# Patient Record
Sex: Male | Born: 1950 | Race: Black or African American | Hispanic: No | Marital: Single | Smoking: Current every day smoker
Health system: Southern US, Community
[De-identification: ages and names within clinical notes are randomized; demographics above are authoritative.]

## PROBLEM LIST (undated history)

## (undated) DIAGNOSIS — I10 Essential (primary) hypertension: Secondary | ICD-10-CM

## (undated) DIAGNOSIS — I639 Cerebral infarction, unspecified: Secondary | ICD-10-CM

## (undated) DIAGNOSIS — E785 Hyperlipidemia, unspecified: Secondary | ICD-10-CM

## (undated) DIAGNOSIS — M1990 Unspecified osteoarthritis, unspecified site: Secondary | ICD-10-CM

## (undated) HISTORY — PX: MULTIPLE TOOTH EXTRACTIONS: SHX2053

## (undated) HISTORY — PX: LEG SURGERY: SHX1003

## (undated) HISTORY — DX: Hyperlipidemia, unspecified: E78.5

---

## 2002-12-03 ENCOUNTER — Inpatient Hospital Stay (HOSPITAL_COMMUNITY): Admission: EM | Admit: 2002-12-03 | Discharge: 2002-12-08 | Payer: Self-pay

## 2002-12-03 ENCOUNTER — Encounter: Payer: Self-pay | Admitting: Emergency Medicine

## 2002-12-07 ENCOUNTER — Encounter: Payer: Self-pay | Admitting: General Surgery

## 2002-12-08 ENCOUNTER — Encounter: Payer: Self-pay | Admitting: General Surgery

## 2004-05-26 ENCOUNTER — Emergency Department (HOSPITAL_COMMUNITY): Admission: EM | Admit: 2004-05-26 | Discharge: 2004-05-27 | Payer: Self-pay | Admitting: Emergency Medicine

## 2004-06-25 ENCOUNTER — Ambulatory Visit: Payer: Self-pay | Admitting: Family Medicine

## 2011-06-03 ENCOUNTER — Emergency Department (HOSPITAL_COMMUNITY): Payer: Medicaid Other

## 2011-06-03 ENCOUNTER — Emergency Department (HOSPITAL_COMMUNITY)
Admission: EM | Admit: 2011-06-03 | Discharge: 2011-06-03 | Disposition: A | Discharge status: Home / Self Care | Payer: Medicaid Other | Attending: Emergency Medicine | Admitting: Emergency Medicine

## 2011-06-03 ENCOUNTER — Encounter: Payer: Self-pay | Admitting: *Deleted

## 2011-06-03 DIAGNOSIS — I10 Essential (primary) hypertension: Secondary | ICD-10-CM

## 2011-06-03 DIAGNOSIS — H53149 Visual discomfort, unspecified: Secondary | ICD-10-CM | POA: Insufficient documentation

## 2011-06-03 DIAGNOSIS — R51 Headache: Secondary | ICD-10-CM

## 2011-06-03 MED ORDER — LISINOPRIL 20 MG PO TABS: 10.0000 mg | ORAL_TABLET | Freq: Every day | ORAL | Status: DC

## 2011-06-03 MED ORDER — PREDNISONE 20 MG PO TABS
60.0000 mg | ORAL_TABLET | Freq: Once | ORAL | Status: AC
  Administered 2011-06-03: 60 mg via ORAL
  Filled 2011-06-03: qty 2
  Filled 2011-06-03: qty 1

## 2011-06-03 MED ORDER — KETOROLAC TROMETHAMINE 10 MG PO TABS
10.0000 mg | ORAL_TABLET | Freq: Once | ORAL | Status: AC
  Administered 2011-06-03: 10 mg via ORAL
  Filled 2011-06-03: qty 1

## 2011-06-03 MED ORDER — DIPHENHYDRAMINE HCL 50 MG/ML IJ SOLN: 25.0000 mg | Freq: Once | INTRAMUSCULAR | Status: DC

## 2011-06-03 MED ORDER — DEXAMETHASONE SODIUM PHOSPHATE 10 MG/ML IJ SOLN: 10.0000 mg | Freq: Once | INTRAMUSCULAR | Status: DC

## 2011-06-03 MED ORDER — METOCLOPRAMIDE HCL 5 MG/ML IJ SOLN: 10.0000 mg | Freq: Once | INTRAMUSCULAR | Status: DC

## 2011-06-03 MED ORDER — SODIUM CHLORIDE 0.9 % IV BOLUS (SEPSIS): 1000.0000 mL | Freq: Once | INTRAVENOUS | Status: DC

## 2011-06-03 MED ORDER — METOCLOPRAMIDE HCL 10 MG PO TABS
10.0000 mg | ORAL_TABLET | Freq: Once | ORAL | Status: AC
  Administered 2011-06-03: 10 mg via ORAL
  Filled 2011-06-03: qty 1

## 2011-06-03 NOTE — ED Notes (Signed)
ASSUMED CARE ON PT. INTRODUCED SELF, CALL LIGHT WITHIN REACH , FAMILY AT BEDSIDE. PT. WAITING FOR EDP/PA EVALUATION .

## 2011-06-03 NOTE — ED Provider Notes (Signed)
History     CSN: [id number] Arrival date & time: 06/03/2011  4:24 PM   First MD Initiated Contact with Patient 06/03/11 1911     7:53 PM HPI Patient is a 60 y.o. male presenting with headaches. The history is provided by the patient.  Headache  This is a new problem. The current episode started more than 2 days ago. The problem occurs constantly. The problem has been gradually worsening. The headache is associated with nothing. Pain location: Left parietal. The pain is at a severity of 9/10. The pain is severe. The pain does not radiate. Pertinent negatives include no anorexia, no fever, no malaise/fatigue, no chest pressure, no near-syncope, no orthopnea, no palpitations, no syncope, no shortness of breath, no nausea and no vomiting. Associated symptoms comments: No neck stiffness, numbness, tingling, weakness, aphasia, ataxia.Marland Kitchen He has tried acetaminophen for the symptoms. The treatment provided no relief.  Carl Gilbert is a 60 y.o. male complaining of headache that gradually began 3 days ago. The pain has worsened. Describes pain as a throbbing pain. Denies history of headaches, injury, tick exposure, or history of CVA. Points to pain located on the left posterior parietal side. Denies neck pain.  History reviewed. No pertinent past medical history.  History reviewed. No pertinent past surgical history.  History reviewed. No pertinent family history.  History  Substance Use Topics  . Smoking status: Current Everyday Smoker  . Smokeless tobacco: Not on file  . Alcohol Use: Yes      Review of Systems  Constitutional: Negative for fever, chills and malaise/fatigue.  HENT: Negative for neck pain and neck stiffness.   Eyes: Positive for photophobia. Negative for pain and discharge.  Respiratory: Negative for shortness of breath.   Cardiovascular: Negative for palpitations, orthopnea, syncope and near-syncope.  Gastrointestinal: Negative for nausea, vomiting and anorexia.    Musculoskeletal: Negative for back pain.  Neurological: Positive for headaches. Negative for dizziness, syncope, weakness and numbness.  Psychiatric/Behavioral: Negative for confusion.    Allergies  Review of patient's allergies indicates no known allergies.  Home Medications   Current Outpatient Rx  Name Route Sig Dispense Refill  . ASPIRIN 325 MG PO TABS Oral Take 325 mg by mouth as needed.        BP 175/100  Pulse 77  Temp(Src) 98.1 F (36.7 C) (Oral)  Resp 18  SpO2 100%  Physical Exam  Vitals reviewed. Constitutional: He is oriented to person, place, and time. He appears well-developed and well-nourished. No distress.  HENT:  Head: Normocephalic and atraumatic.  Eyes: Conjunctivae are normal. Pupils are equal, round, and reactive to light.  Neck: Normal range of motion. Neck supple.  Cardiovascular: Normal rate, regular rhythm and normal heart sounds.   Pulmonary/Chest: Effort normal and breath sounds normal.  Abdominal: Soft. Bowel sounds are normal.  Neurological: He is alert and oriented to person, place, and time. He has normal strength. No cranial nerve deficit or sensory deficit. Coordination normal. GCS eye subscore is 4. GCS verbal subscore is 5. GCS motor subscore is 6.  Skin: Skin is warm and dry. No rash noted. He is not diaphoretic. No erythema. No pallor.  Psychiatric: He has a normal mood and affect. His behavior is normal.    ED Course  Procedures    MDM    8:43 PM Patient has a phobia of needles. And is refusing medication. Will give PO meds.   Patient's pain improved. CT normal. Ready for discharge. Will provide referrals for a primary care  physician regarding hypertension. Discussed necessity of blood pressure control. Discussed concerning symptoms of headache that would indicate return to ED. Patient and spouse sitting outside door waiting for paperwork ready for discharge.  Thomasene Lot, [place] 06/03/11 2147

## 2011-06-03 NOTE — ED Notes (Signed)
B. NGUYEN AT BEDSIDE EVALUATING PT.

## 2011-06-03 NOTE — ED Notes (Signed)
PA AT BEDSIDE SPEAKING WITH PT.

## 2011-06-03 NOTE — ED Notes (Signed)
Headache for the past 3 days he does not have a history of the same.  No nv he has seen dots in his vision

## 2011-06-03 NOTE — ED Notes (Signed)
PT. RETURNED FROM CT SCAN , REFUSED IV FLUIDS /IV MEDUCATIONS - PA NOTIFIED AND WILL SPEAK WITH PT.

## 2011-06-03 NOTE — ED Provider Notes (Signed)
Medical screening examination/treatment/procedure(s) were performed by non-physician practitioner and as supervising physician I was immediately available for consultation/collaboration.   Benny Lennert, MD 06/03/11 2212

## 2011-06-05 ENCOUNTER — Emergency Department (HOSPITAL_COMMUNITY): Payer: Medicaid Other

## 2011-06-05 ENCOUNTER — Encounter (HOSPITAL_COMMUNITY): Payer: Self-pay

## 2011-06-05 ENCOUNTER — Inpatient Hospital Stay (HOSPITAL_COMMUNITY)
Admission: EM | Admit: 2011-06-05 | Discharge: 2011-06-11 | DRG: 065 | Disposition: A | Discharge status: Rehabilitation Facility | Payer: Medicaid Other | Source: Ambulatory Visit | Attending: Neurology | Admitting: Neurology

## 2011-06-05 ENCOUNTER — Other Ambulatory Visit: Payer: Self-pay

## 2011-06-05 DIAGNOSIS — E7800 Pure hypercholesterolemia, unspecified: Secondary | ICD-10-CM | POA: Diagnosis present

## 2011-06-05 DIAGNOSIS — I639 Cerebral infarction, unspecified: Secondary | ICD-10-CM

## 2011-06-05 DIAGNOSIS — I6340 Cerebral infarction due to embolism of unspecified cerebral artery: Principal | ICD-10-CM | POA: Diagnosis present

## 2011-06-05 DIAGNOSIS — F1010 Alcohol abuse, uncomplicated: Secondary | ICD-10-CM | POA: Diagnosis present

## 2011-06-05 DIAGNOSIS — E871 Hypo-osmolality and hyponatremia: Secondary | ICD-10-CM | POA: Diagnosis not present

## 2011-06-05 DIAGNOSIS — I10 Essential (primary) hypertension: Secondary | ICD-10-CM | POA: Diagnosis present

## 2011-06-05 DIAGNOSIS — I6350 Cerebral infarction due to unspecified occlusion or stenosis of unspecified cerebral artery: Secondary | ICD-10-CM | POA: Diagnosis present

## 2011-06-05 DIAGNOSIS — Z7982 Long term (current) use of aspirin: Secondary | ICD-10-CM

## 2011-06-05 DIAGNOSIS — R4701 Aphasia: Secondary | ICD-10-CM | POA: Diagnosis present

## 2011-06-05 DIAGNOSIS — Z79899 Other long term (current) drug therapy: Secondary | ICD-10-CM

## 2011-06-05 DIAGNOSIS — I69320 Aphasia following cerebral infarction: Secondary | ICD-10-CM

## 2011-06-05 DIAGNOSIS — G8190 Hemiplegia, unspecified affecting unspecified side: Secondary | ICD-10-CM | POA: Diagnosis present

## 2011-06-05 DIAGNOSIS — F17200 Nicotine dependence, unspecified, uncomplicated: Secondary | ICD-10-CM | POA: Diagnosis present

## 2011-06-05 DIAGNOSIS — IMO0002 Reserved for concepts with insufficient information to code with codable children: Secondary | ICD-10-CM

## 2011-06-05 HISTORY — DX: Essential (primary) hypertension: I10

## 2011-06-05 LAB — CBC
HCT: 42 % (ref 39.0–52.0)
Hemoglobin: 14.7 g/dL (ref 13.0–17.0)
MCH: 28.4 pg (ref 26.0–34.0)
MCV: 84.7 fL (ref 78.0–100.0)
MCV: 85 fL (ref 78.0–100.0)
RBC: 5.17 MIL/uL (ref 4.22–5.81)
RDW: 14.4 % (ref 11.5–15.5)
WBC: 12.3 10*3/uL — ABNORMAL HIGH (ref 4.0–10.5)

## 2011-06-05 LAB — POCT I-STAT, CHEM 8
BUN: 19 mg/dL (ref 6–23)
Creatinine, Ser: 1 mg/dL (ref 0.50–1.35)
Hemoglobin: 16.3 g/dL (ref 13.0–17.0)
Potassium: 3.9 mEq/L (ref 3.5–5.1)
Sodium: 140 mEq/L (ref 135–145)

## 2011-06-05 LAB — POCT I-STAT TROPONIN I: Troponin i, poc: 0 ng/mL (ref 0.00–0.08)

## 2011-06-05 LAB — GLUCOSE, CAPILLARY: Glucose-Capillary: 107 mg/dL — ABNORMAL HIGH (ref 70–99)

## 2011-06-05 MED ORDER — ACETAMINOPHEN 650 MG RE SUPP: 650.0000 mg | RECTAL | Status: DC | PRN

## 2011-06-05 MED ORDER — ONDANSETRON HCL 4 MG/2ML IJ SOLN: 4.0000 mg | Freq: Four times a day (QID) | INTRAMUSCULAR | Status: DC | PRN

## 2011-06-05 MED ORDER — SENNOSIDES-DOCUSATE SODIUM 8.6-50 MG PO TABS
1.0000 | ORAL_TABLET | Freq: Every evening | ORAL | Status: DC | PRN
  Filled 2011-06-05: qty 1

## 2011-06-05 MED ORDER — SODIUM CHLORIDE 0.9 % IV SOLN
INTRAVENOUS | Status: DC
  Administered 2011-06-05: 100 mL/h via INTRAVENOUS

## 2011-06-05 MED ORDER — LABETALOL HCL 5 MG/ML IV SOLN
10.0000 mg | INTRAVENOUS | Status: DC | PRN
  Filled 2011-06-05: qty 16

## 2011-06-05 MED ORDER — PANTOPRAZOLE SODIUM 40 MG IV SOLR
40.0000 mg | Freq: Every day | INTRAVENOUS | Status: DC
Start: 2011-06-06 — End: 2011-06-06

## 2011-06-05 MED ORDER — ACETAMINOPHEN 325 MG PO TABS: 650.0000 mg | ORAL_TABLET | ORAL | Status: DC | PRN

## 2011-06-05 NOTE — H&P (Addendum)
Admission H&P   Referring MD ; Dr April Calumbo Chief Complaint:speech difficulties and headacheHPI: [name] is an 60 y.o. male   who is unable to provide history which is provided by his wife who was present at the bedside. He went with his wife to [employer] and they returned at 5 PM. Wife noticed that his speech was gibberish and he had trouble speaking. He also appeared to be confused and could not understand her. Patient was brought to [facility] emergency room at 6:47 PM and and was found to have an elevated blood pressure. With treatment  it came down but the patient continued to have speech difficulties. A code stroke was called. However when the  rapid response nurse evaluated the patient he showed significant improvement with NIH score 1 only and hence she was not eligible for TPA  And she spoke to me over the phone and   Sent patient for an MRI. When I arrived the patient was having an MRI I spoke to the patient's   Wife and daughter both of whom concurred that there was significant neurological improvement.   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 By my exam the patient still has persistent aphasia but had no significant facial weakness or extremity weakness. It is more than 4 hours since the time of onset of his stroke symptoms and presence of an infarct on the MRI puts him in high risk category for developing TPA related  intracerebral hemorrhage. I had a long 20 minute discussion with the patient's wife as well as with 2 of his sisters    about  benefit of IV TPA ibeyond the three-hour window being off label and the risk of hemorrhage is higher. The family understands and due to his continuing improvement I feel iv   TPA would not be indicated  LSN:[id number] tPA Given: no due to rapid improvement upon arrival and fluctuating exam    Past Medical History  Diagnosis Date  . Hypertension     History reviewed. No pertinent past surgical history.  History reviewed. No pertinent family history. Social History:  reports that he has been smoking.  He does not have any smokeless tobacco history on file. He reports that he drinks alcohol. He reports that he does not use illicit drugs.  Allergies: No Known Allergies  Medications Prior to Admission  Medication Dose Route Frequency Provider Last Rate Last Dose  . 0.9 %  sodium chloride infusion   Intravenous Continuous April K Palumbo-Rasch, MD       Medications Prior to Admission  Medication Sig Dispense Refill  . aspirin 325 MG tablet Take 325 mg by mouth as needed. For pain.      Marland Kitchen lisinopril (PRINIVIL,ZESTRIL) 20 MG tablet Take 0.5 tablets (10 mg total) by mouth daily.  31 tablet  0    ROS: Negative for chest pain, dyspnoea, diarrhoea, nausea, fever, Positive for dizziness, headace, speech difficulties  Physical Examination: Blood pressure 160/72, pulse 80, temperature 97.9 F (36.6 C), temperature source Oral, resp. rate 18, height 5\' 9"  (1.753 m), weight 68.04 kg (150 lb), SpO2 100.00%.  HEENT-  Normocephalic, no lesions, without obvious abnormality.  Normal external eye and conjunctiva.  Normal TM's bilaterally.  Normal auditory canals and external ears. Normal external nose, mucus membranes and septum.  Normal pharynx. Neck supple with no masses, nodes, nodules or enlargement. Cardiovascular - Normal heart sounds no murmur or gallop areas Lungs -  Clear to auscultation Abdomen - Soft nontender Extremities - No pedal edema Neurologic Examination:  Awake alert and moderate expressive and mild receptive aphasia. Follows midline and simple one-step commands. Speech is nonfluent with paraphasic errors and word hesitancy. I moments are full range without  nystagmus. Blinks to threat bilaterally. There is mild visual neglect on the right. No upper or lower extremity drift. Symmetric and equal strength in all 4 extremities. Fine finger movements are mildly diminished on the right. Finger-to-nose and needle cognition appear intact. Sensation cannot be reliably tested but there may be mild sensory inattention on the right.  NIH stroke scale score 5 per me and 1 per rapid response nurse  Results for orders placed during the hospital encounter of 06/05/11 (from the past 48 hour(s))  GLUCOSE, CAPILLARY     Status: Abnormal   Collection Time   06/05/11  7:11 PM      Component Value Range Comment   Glucose-Capillary 107 (*) 70 - 99 (mg/dL)   CBC     Status: Abnormal   Collection Time   06/05/11  7:18 PM      Component Value Range Comment   WBC 13.4 (*) 4.0 - 10.5 (K/uL)    RBC 5.17  4.22 - 5.81 (MIL/uL)    Hemoglobin 14.7  13.0 - 17.0 (g/dL)    HCT 40.9  81.1 - 91.4 (%)    MCV 84.7  78.0 - 100.0 (fL)    MCH 28.4  26.0 - 34.0 (pg)    MCHC 33.6  30.0 - 36.0 (g/dL)    RDW 78.2  95.6 - 21.3 (%)    Platelets 186  150 - 400 (K/uL)   PROTIME-INR     Status: Normal   Collection Time   06/05/11  7:18 PM      Component Value Range Comment   Prothrombin Time 13.1  11.6 - 15.2 (seconds)    INR 0.97  0.00 - 1.49    POCT I-STAT, CHEM 8     Status: Normal   Collection Time   06/05/11  7:34 PM      Component Value Range Comment   Sodium 140  135 - 145 (mEq/L)    Potassium 3.9  3.5 - 5.1 (mEq/L)    Chloride 105  96 - 112 (mEq/L)    BUN 19  6 - 23 (mg/dL)    Creatinine, Ser 0.86  0.50 - 1.35 (mg/dL)    Glucose, Bld 94  70 - 99 (mg/dL)    Calcium, Ion 5.78  1.12 - 1.32 (mmol/L)    TCO2 25  0 - 100 (mmol/L)    Hemoglobin 16.3  13.0 - 17.0 (g/dL)    HCT 46.9  62.9 - 52.8 (%)   POCT I-STAT TROPONIN I     Status: Normal   Collection Time   06/05/11  7:35 PM      Component Value Range Comment   Troponin i, poc 0.00  0.00 - 0.08 (ng/mL)    Comment 3              Ct Head Wo Contrast  06/05/2011  *RADIOLOGY REPORT*  Clinical Data: Code stroke.  Right upper extremity weakness. Expressive aphasia.  CT HEAD WITHOUT CONTRAST 06/13/2011:  Technique:  Contiguous axial images were  obtained from the base of the skull through the vertex without contrast.  Comparison: Unenhanced CT head 2 days ago, 06/03/2011.  Findings: Patient motion blurred many of the images on the current examination; these were repeated and a diagnostic study was obtained.  Ventricular system normal in size and appearance for age.  No evidence of interval stroke development.  Severe chronic microvascular ischemic changes of the white matter.  Cavum septum pellucidum.  No mass lesion.  No midline shift.  No acute hemorrhage or hematoma.  No extra-axial fluid collections.  No evidence of acute infarction.  No significant interval change.  No focal osseous abnormality involving the skull.  Visualized paranasal sinuses, mastoid air cells, and middle ear cavities well- aerated.  IMPRESSION:  1.  No acute intracranial abnormality. 2.  Severe chronic microvascular ischemic changes of the white matter diffusely. 3.  No evidence of interval stroke development since the examination 2 days ago.  These results were called by telephone on 06/05/2011  at  1930 hours to  Dr. Terressa Koyanagi of the emergency department, who verbally acknowledged these results.  Original Report Authenticated By: Arnell Sieving, M.D.    Assessment: 60 y.o. male Who had sudden onset of aphasia and language difficulties with fluctuating exam and rapid improvement. 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   Stroke Risk Factors - Smoking .Hypertension. Plan: Patient has prevented within 4-5 hours from onset of the stroke symptoms he may still be eligible for TPA Off label use but after discussion with the  patient's wife and family members about the risk of TPA related hemorrhage we have decided not to give TPA since he seems to be improving.  . I had a long discussion with the patient's Daughter, wife and 2 sisters regarding his clinical presentation, plan of care and answered questions. Patient will be admitted for further stroke workup  And will check echocardiogram, and Doppler studies, fasting lipid profile and hemoglobin A1c. Speech therapy consults and swallow eval as well as physical and occupational therapist. Hopefully patient's condition   continues to improve over the next several hours.Incase he does not improve may consider possible participation in the POINT or DIAS stroke study if the family is interested.   06/05/2011, 9:32 PM  Patient had slight neurological worsening after MRI by my exam had  NIH stroke scale of 5. I met with the patient's sisters and significant other and discussed possible participation in the DIAS clinical trial. The family appears interested. I discussed the risk benefits of the medication  in details and answered questions.Family he was given an opportunity to ask questions and time to think to make their decision. Patient was a randomized into the DIAS trial. Patient had a CT angiogram of the brain performed as per study protocol.No study specific procedures were performed prior to signing off the consent form.  This patient is critically ill and at significant risk of neurological worsening, death and care requires constant monitoring of vital signs, hemodynamics,respiratory and cardiac monitoring, neurological assessment, discussion with family, other specialists and medical decision making of high complexity. I spent 90 minutes of neurocritical care time  in the care of  this patient.

## 2011-06-05 NOTE — ED Notes (Signed)
Patient transported to CT w/ RN. 

## 2011-06-05 NOTE — ED Notes (Signed)
MD at bedside. Neurologist evaluating the patient. Patient is back from MRI

## 2011-06-05 NOTE — ED Provider Notes (Signed)
History     CSN: [id number] Arrival date & time: 06/05/2011  6:47 PM   First MD Initiated Contact with Patient 06/05/11 1858      Chief Complaint  Patient presents with  . Hypertension    pt with BP of 210/110,  90, CBG 127, c/o headache rep questions and inability to answer properly, 12 lead OK    (Consider location/radiation/quality/duration/timing/severity/associated sxs/prior treatment) Patient is a 60 y.o. male presenting with hypertension. The history is provided by the EMS personnel and the spouse. The history is limited by the condition of the patient. No language interpreter was used.  Hypertension This is a chronic problem. The current episode started more than 1 week ago. The problem occurs constantly. The problem has been gradually worsening. Associated symptoms include headaches. Pertinent negatives include no chest pain. The symptoms are aggravated by nothing. The symptoms are relieved by nothing. He has tried nothing for the symptoms. The treatment provided no relief.  Has not filled the RX he was given and then tonight had difficulty making speech and was not acting like himself.  EMS called.  Patient unable to speak intelligibly on arrival    Past Medical History  Diagnosis Date  . Hypertension     No past surgical history on file.  History reviewed. No pertinent family history.  History  Substance Use Topics  . Smoking status: Current Everyday Smoker  . Smokeless tobacco: Not on file  . Alcohol Use: Yes      Review of Systems  Unable to perform ROS Cardiovascular: Negative for chest pain.  Neurological: Positive for headaches.    Allergies  Review of patient's allergies indicates no known allergies.  Home Medications   Current Outpatient Rx  Name Route Sig Dispense Refill  . ASPIRIN 325 MG PO TABS Oral Take 325 mg by mouth as needed. For pain.    Marland Kitchen LISINOPRIL 20 MG PO TABS Oral Take 0.5 tablets (10 mg total) by mouth daily. 31 tablet 0    BP  160/72  Pulse 80  Temp(Src) 97.9 F (36.6 C) (Oral)  Resp 18  Ht 5\' 9"  (1.753 m)  Wt 150 lb (68.04 kg)  BMI 22.15 kg/m2  SpO2 100%  Physical Exam  Constitutional: He appears well-developed and well-nourished. No distress.  HENT:  Head: Normocephalic and atraumatic.  Right Ear: Tympanic membrane is not injected. No hemotympanum.  Left Ear: Tympanic membrane is not injected. No hemotympanum.  Mouth/Throat: Oropharynx is clear and moist.  Eyes: Conjunctivae and EOM are normal. Pupils are equal, round, and reactive to light.  Neck: Normal range of motion. Neck supple. No JVD present.  Cardiovascular: Normal rate, regular rhythm and normal heart sounds.   Pulmonary/Chest: Effort normal and breath sounds normal. No stridor. He has no wheezes.  Abdominal: Soft. Bowel sounds are normal. There is no tenderness. There is no rebound.  Musculoskeletal: Normal range of motion. He exhibits no edema.  Lymphadenopathy:    He has no cervical adenopathy.  Neurological: He is alert. A cranial nerve deficit is present.  Reflex Scores:      Bicep reflexes are 2+ on the right side and 2+ on the left side.      Brachioradialis reflexes are 2+ on the right side and 2+ on the left side.      Patellar reflexes are 2+ on the right side and 2+ on the left side.      Expressive aphasia and dysarthria  Skin: He is not diaphoretic.  ED Course  Procedures (including critical care time)  Labs Reviewed  CBC - Abnormal; Notable for the following:    WBC 13.4 (*)    All other components within normal limits  GLUCOSE, CAPILLARY - Abnormal; Notable for the following:    Glucose-Capillary 107 (*)    All other components within normal limits  PROTIME-INR  POCT I-STAT, CHEM 8  POCT I-STAT TROPONIN I  I-STAT TROPONIN I  I-STAT, CHEM 8  URINE RAPID DRUG SCREEN (HOSP PERFORMED)   Ct Head Wo Contrast  06/05/2011  *RADIOLOGY REPORT*  Clinical Data: Code stroke.  Right upper extremity weakness. Expressive  aphasia.  CT HEAD WITHOUT CONTRAST 06/13/2011:  Technique:  Contiguous axial images were obtained from the base of the skull through the vertex without contrast.  Comparison: Unenhanced CT head 2 days ago, 06/03/2011.  Findings: Patient motion blurred many of the images on the current examination; these were repeated and a diagnostic study was obtained.  Ventricular system normal in size and appearance for age.  No evidence of interval stroke development.  Severe chronic microvascular ischemic changes of the white matter.  Cavum septum pellucidum.  No mass lesion.  No midline shift.  No acute hemorrhage or hematoma.  No extra-axial fluid collections.  No evidence of acute infarction.  No significant interval change.  No focal osseous abnormality involving the skull.  Visualized paranasal sinuses, mastoid air cells, and middle ear cavities well- aerated.  IMPRESSION:  1.  No acute intracranial abnormality. 2.  Severe chronic microvascular ischemic changes of the white matter diffusely. 3.  No evidence of interval stroke development since the examination 2 days ago.  These results were called by telephone on 06/05/2011  at  1930 hours to  Dr. Terressa Koyanagi of the emergency department, who verbally acknowledged these results.  Original Report Authenticated By: Arnell Sieving, M.D.     No diagnosis found. Results for orders placed during the hospital encounter of 06/05/11  CBC      Component Value Range   WBC 13.4 (*) 4.0 - 10.5 (K/uL)   RBC 5.17  4.22 - 5.81 (MIL/uL)   Hemoglobin 14.7  13.0 - 17.0 (g/dL)   HCT 91.4  78.2 - 95.6 (%)   MCV 84.7  78.0 - 100.0 (fL)   MCH 28.4  26.0 - 34.0 (pg)   MCHC 33.6  30.0 - 36.0 (g/dL)   RDW 21.3  08.6 - 57.8 (%)   Platelets 186  150 - 400 (K/uL)  PROTIME-INR      Component Value Range   Prothrombin Time 13.1  11.6 - 15.2 (seconds)   INR 0.97  0.00 - 1.49   GLUCOSE, CAPILLARY      Component Value Range   Glucose-Capillary 107 (*) 70 - 99 (mg/dL)  POCT  I-STAT, CHEM 8      Component Value Range   Sodium 140  135 - 145 (mEq/L)   Potassium 3.9  3.5 - 5.1 (mEq/L)   Chloride 105  96 - 112 (mEq/L)   BUN 19  6 - 23 (mg/dL)   Creatinine, Ser 4.69  0.50 - 1.35 (mg/dL)   Glucose, Bld 94  70 - 99 (mg/dL)   Calcium, Ion 6.29  5.28 - 1.32 (mmol/L)   TCO2 25  0 - 100 (mmol/L)   Hemoglobin 16.3  13.0 - 17.0 (g/dL)   HCT 41.3  24.4 - 01.0 (%)  POCT I-STAT TROPONIN I      Component Value Range   Troponin i, poc  0.00  0.00 - 0.08 (ng/mL)   Comment 3            Ct Head Wo Contrast  06/05/2011  *RADIOLOGY REPORT*  Clinical Data: Code stroke.  Right upper extremity weakness. Expressive aphasia.  CT HEAD WITHOUT CONTRAST 06/13/2011:  Technique:  Contiguous axial images were obtained from the base of the skull through the vertex without contrast.  Comparison: Unenhanced CT head 2 days ago, 06/03/2011.  Findings: Patient motion blurred many of the images on the current examination; these were repeated and a diagnostic study was obtained.  Ventricular system normal in size and appearance for age.  No evidence of interval stroke development.  Severe chronic microvascular ischemic changes of the white matter.  Cavum septum pellucidum.  No mass lesion.  No midline shift.  No acute hemorrhage or hematoma.  No extra-axial fluid collections.  No evidence of acute infarction.  No significant interval change.  No focal osseous abnormality involving the skull.  Visualized paranasal sinuses, mastoid air cells, and middle ear cavities well- aerated.  IMPRESSION:  1.  No acute intracranial abnormality. 2.  Severe chronic microvascular ischemic changes of the white matter diffusely. 3.  No evidence of interval stroke development since the examination 2 days ago.  These results were called by telephone on 06/05/2011  at  1930 hours to  Dr. Terressa Koyanagi of the emergency department, who verbally acknowledged these results.  Original Report Authenticated By: Arnell Sieving, M.D.    Ct Head Wo Contrast  06/03/2011  *RADIOLOGY REPORT*  Clinical Data: Headache, blurred vision, hypertension  CT HEAD WITHOUT CONTRAST  Technique:  Contiguous axial images were obtained from the base of the skull through the vertex without contrast.  Comparison: None.  Findings: No acute intracranial hemorrhage, infarction, mass lesion.  Chronic microvascular changes throughout the white matter. Ventricles symmetric.  Cisterns patent.   No Cerebellar abnormality.  No orbital abnormality.  Mastoid sinuses clear.  IMPRESSION: Chronic microvascular changes.  No acute finding.  Original Report Authenticated By: Judie Petit. Ruel Favors, M.D.     MDM   Date: 06/05/2011  Rate:88  Rhythm: normal sinus rhythm  QRS Axis: normal  Intervals: normal  ST/T Wave abnormalities: nonspecific ST changes  Conduction Disutrbances:none  Narrative Interpretation:   Old EKG Reviewed: none available    MDM Reviewed: nursing note and vitals Interpretation: x-ray, CT scan, ECG and labs        Kadon Andrus Smitty Cords, MD 06/05/11 2059

## 2011-06-05 NOTE — ED Notes (Signed)
Neurologist talked to family, patient is still in MRI

## 2011-06-05 NOTE — ED Notes (Addendum)
Patient is alert but confused. Patient asking "what is happening" and "something does not feel right". Family stated that patient was sitting in the couch with his wife when wife heard him making funny noise. She asked him if something wrong and he told her no, she stated she noted that he was drooling. After that he stood up and went to bathroom dragging his feet per wife story. He later went to kitchen and ate some and after that wife noted that his speech was slurred so she called 911. Patient is confused about events and that is the only neurological deficit at this time

## 2011-06-06 ENCOUNTER — Emergency Department (HOSPITAL_COMMUNITY): Payer: Medicaid Other

## 2011-06-06 ENCOUNTER — Other Ambulatory Visit: Payer: Self-pay

## 2011-06-06 ENCOUNTER — Inpatient Hospital Stay (HOSPITAL_COMMUNITY): Payer: Medicaid Other

## 2011-06-06 LAB — URINALYSIS, DIPSTICK ONLY
Bilirubin Urine: NEGATIVE
Hgb urine dipstick: NEGATIVE
Ketones, ur: NEGATIVE mg/dL
Nitrite: NEGATIVE
pH: 7 (ref 5.0–8.0)

## 2011-06-06 LAB — COMPREHENSIVE METABOLIC PANEL
Albumin: 3.6 g/dL (ref 3.5–5.2)
Albumin: 3.7 g/dL (ref 3.5–5.2)
Alkaline Phosphatase: 127 U/L — ABNORMAL HIGH (ref 39–117)
BUN: 13 mg/dL (ref 6–23)
BUN: 14 mg/dL (ref 6–23)
CO2: 25 mEq/L (ref 19–32)
Chloride: 101 mEq/L (ref 96–112)
Chloride: 104 mEq/L (ref 96–112)
Creatinine, Ser: 0.94 mg/dL (ref 0.50–1.35)
GFR calc Af Amer: >90 mL/min (ref >90)
GFR calc Af Amer: >90 mL/min (ref >90)
GFR calc non Af Amer: 89 mL/min — ABNORMAL LOW (ref >90)
Glucose, Bld: 129 mg/dL — ABNORMAL HIGH (ref 70–99)
Potassium: 3.6 mEq/L (ref 3.5–5.1)
Total Bilirubin: 0.3 mg/dL (ref 0.3–1.2)
Total Bilirubin: 0.4 mg/dL (ref 0.3–1.2)

## 2011-06-06 LAB — HEMOGLOBIN A1C
Hgb A1c MFr Bld: 5.5 % (ref <5.7)
Mean Plasma Glucose: 111 mg/dL (ref <117)

## 2011-06-06 LAB — MRSA PCR SCREENING: MRSA by PCR: NEGATIVE

## 2011-06-06 LAB — RAPID URINE DRUG SCREEN, HOSP PERFORMED
Amphetamines: NOT DETECTED
Benzodiazepines: NOT DETECTED
Opiates: NOT DETECTED

## 2011-06-06 LAB — PROTIME-INR: Prothrombin Time: 13.5 seconds (ref 11.6–15.2)

## 2011-06-06 LAB — CBC
HCT: 41.9 % (ref 39.0–52.0)
Hemoglobin: 14.2 g/dL (ref 13.0–17.0)
WBC: 11.4 10*3/uL — ABNORMAL HIGH (ref 4.0–10.5)

## 2011-06-06 LAB — LIPID PANEL
Cholesterol: 159 mg/dL (ref 0–200)
Triglycerides: 89 mg/dL (ref <150)

## 2011-06-06 MED ORDER — STUDY - INVESTIGATIONAL DRUG SIMPLE RECORD
90.0000 ug/kg | Freq: Once | Status: DC
  Filled 2011-06-06 (×2): qty 6.12

## 2011-06-06 MED ORDER — IOHEXOL 350 MG/ML SOLN
50.0000 mL | Freq: Once | INTRAVENOUS | Status: AC | PRN
  Administered 2011-06-06: 50 mL via INTRAVENOUS

## 2011-06-06 MED ORDER — PANTOPRAZOLE SODIUM 40 MG IV SOLR
40.0000 mg | Freq: Every day | INTRAVENOUS | Status: DC
  Administered 2011-06-06: 40 mg via INTRAVENOUS
  Filled 2011-06-06 (×2): qty 40

## 2011-06-06 MED ORDER — ASPIRIN 325 MG PO TABS
325.0000 mg | ORAL_TABLET | Freq: Every day | ORAL | Status: DC
  Administered 2011-06-06 – 2011-06-11 (×6): 325 mg via ORAL
  Filled 2011-06-06 (×6): qty 1

## 2011-06-06 MED ORDER — SENNOSIDES-DOCUSATE SODIUM 8.6-50 MG PO TABS
1.0000 | ORAL_TABLET | Freq: Every evening | ORAL | Status: DC | PRN
  Filled 2011-06-06: qty 1

## 2011-06-06 MED ORDER — ACETAMINOPHEN 650 MG RE SUPP: 650.0000 mg | RECTAL | Status: DC | PRN

## 2011-06-06 MED ORDER — LABETALOL HCL 5 MG/ML IV SOLN
10.0000 mg | INTRAVENOUS | Status: DC | PRN
Start: 2011-06-06 — End: 2011-06-07
  Filled 2011-06-06: qty 16

## 2011-06-06 MED ORDER — ACETAMINOPHEN 325 MG PO TABS
650.0000 mg | ORAL_TABLET | ORAL | Status: DC | PRN
  Administered 2011-06-09: 650 mg via ORAL
  Filled 2011-06-06: qty 2

## 2011-06-06 MED ORDER — LISINOPRIL 10 MG PO TABS
10.0000 mg | ORAL_TABLET | Freq: Every day | ORAL | Status: DC
  Administered 2011-06-06 – 2011-06-08 (×3): 10 mg via ORAL
  Filled 2011-06-06 (×4): qty 1

## 2011-06-06 MED ORDER — ONDANSETRON HCL 4 MG/2ML IJ SOLN: 4.0000 mg | Freq: Four times a day (QID) | INTRAMUSCULAR | Status: DC | PRN

## 2011-06-06 NOTE — Research (Signed)
Patient was given the DIAS-4 study drug at 00:38-00:39 by Dr. Pearlean Brownie.

## 2011-06-06 NOTE — Research (Signed)
Patient was admitted to the hospital for acute stroke. Reported patient waken confused at 17:15 but was said to be completely normal at 17:00. Patient was evaluated by Dr. Pearlean Brownie and identified as a possible DIAS-4 candidate. Patient and family were approached by Dr. Pearlean Brownie about the DIAS-4 trial and given the informed consent form to read. Patient and family were given the opportunity to ask questions about the informed consent. No study related procedures were performed prior to the consenting process. Patient agreed to participate and the informed consent was signed by patients sister Patric Dykes. A copy of the signed informed consent was given to patient and family. Patient was unable to sign the informed consent due to stroke symptoms. Patient meets the inclusion exclusion criteria. CTA was performed to verify eligibility. Reported by Dr. 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 Patient was randomized into the DIAS -4 trial.

## 2011-06-06 NOTE — Progress Notes (Signed)
*  PRELIMINARY RESULTS* Echocardiogram 2D Echocardiogram has been performed.  Glean Salen [facility] 06/06/2011, 9:24 AM

## 2011-06-06 NOTE — Progress Notes (Signed)
*  PRELIMINARY RESULTS*  Carotid Dopplers has been performed.  No Significant ICA stenosis bilaterally. Vertebral arteries are patent with antegrade flow.  Mila Homer 06/06/2011, 1:52 PM

## 2011-06-06 NOTE — Progress Notes (Signed)
Stroke Team Progress Note  SUBJECTIVE  Carl Gilbert is a 60 y.o. Male who had sudden onset of aphasia and language difficulties with fluctuating exam and rapid initial improvement. 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 However since the MRI confirmed infarct and he had left M 2 occlusion and neurological exam showed some worsening he was offered participation in the DIAS 4 stroke study and was randomized into the study last night. He has remained stable overnight without any hemodynamic changes. He remains aphasic without significant improvement or worsening of his condition. No new significant events noted overnight. Urine drug screen was positive for cannabis. Lipid profile was normal. .     OBJECTIVE Physical Exam:   BP 144/76  Pulse 66  Temp(Src) 97.7 F (36.5 C) (Oral)  Resp 21  Ht 5\' 10"  (1.778 m)  Wt 81.3 kg (179 lb 3.7 oz)  BMI 25.72 kg/m2  SpO2 100% BP 144/76  Pulse 66  Temp(Src) 97.7 F (36.5 C) (Oral)  Resp 21  Ht 5\' 10"  (1.778 m)  Wt 81.3 kg (179 lb 3.7 oz)  BMI 25.72 kg/m2  SpO2 100%  General Appearance:    Alert, cooperative, no distress, appears stated age  Head:    Normocephalic, without obvious abnormality, atraumatic  Eyes:    PERRL, conjunctiva/corneas clear, EOM's intact, fundi    benign, both eyes                  Neck:   Supple, symmetrical, trachea midline, no carotid   bruit or JVD      Lungs:     Clear to auscultation bilaterally, respirations unlabored  Chest wall:    No tenderness or deformity  Heart:    Regular rate and rhythm, S1 and S2 normal, no murmur, rub   or gallop  Abdomen:     Soft, non-tender, bowel sounds active all four quadrants,    no masses, no organomegaly         Extremities:   Extremities normal, atraumatic, no cyanosis or edema    Pulses:   2+ and symmetric all extremities  Skin:   Skin color, texture, turgor normal, no rashes or lesions     Neurologic:   Awake alert aphasic. Nonfluent speech with significant paraphasic errors and word finding difficulties. Able to follow midline and simple one-step commands only. Unable to repeat can name only a few simple objects. Eye movements are full range without nystagmus. Blinks to threat bilaterally. There is right visual neglect and inattention. No facial weakness. Tongue is midline. He is unable to tell me the month and his age. Motor system exam reveals no upper or lower extremity drift. Symmetric and equal strength not 4 extremities. Finger to nose and knee tibial cognition are accurate. Withdraws to painful stimuli in all 4 extremities equally. Plantars are downgoing.  NIH stroke scale is 5      Most recent Vital Signs: Temp: 97.7 F (36.5 C) (11/22 0744) Temp src: Oral (11/22 0744) BP: 144/76 mmHg (11/22 0800) Pulse Rate: 66  (11/22 0800) Respiratory Rate: 21 O2 Saturdation: 100%  CBG (last 3)   Basename 06/05/11 1911  GLUCAP 107*   Intake/Output from previous day: 11/21 0701 - 11/22 0700 In: 1175 [I.V.:1175] Out: 500 [Urine:500]  IV Fluid Intake:     . sodium chloride  100 mL/hr at 06/06/11 0800   Diet:  General  regular Activity:  Up with assistance  DVT Prophylaxis SCDs  Studies: CBC    Component Value Date/Time   WBC 12.3* 06/05/2011 2336   RBC 4.94 06/05/2011 2336   HGB 14.1 06/05/2011 2336   HCT 42.0 06/05/2011 2336   PLT 175 06/05/2011 2336   MCV 85.0 06/05/2011 2336   MCH 28.5 06/05/2011 2336   MCHC 33.6 06/05/2011 2336   RDW 14.4 06/05/2011 2336   CMP    Component Value Date/Time   NA 135 06/05/2011 2336   K 3.9 06/05/2011 2336   CL 101 06/05/2011 2336   CO2 25 06/05/2011 2336   GLUCOSE 101* 06/05/2011 2336   BUN 14 06/05/2011 2336   CREATININE 0.94 06/05/2011 2336   CALCIUM 8.9 06/05/2011 2336   PROT 7.1 06/05/2011 2336    ALBUMIN 3.7 06/05/2011 2336   AST 13 06/05/2011 2336   ALT 15 06/05/2011 2336   ALKPHOS 110 06/05/2011 2336   BILITOT 0.3 06/05/2011 2336   GFRNONAA 89* 06/05/2011 2336   GFRAA >90 06/05/2011 2336   COAGS Lab Results  Component Value Date   INR 0.97 06/05/2011   Lipid Panel    Component Value Date/Time   CHOL 159 06/06/2011 0445   TRIG 89 06/06/2011 0445   HDL 43 06/06/2011 0445   CHOLHDL 3.7 06/06/2011 0445   VLDL 18 06/06/2011 0445   LDLCALC 98 06/06/2011 0445   HgbA1C  No results found for this basename: HGBA1C   Urine Drug Screen    Component Value Date/Time   LABOPIA NONE DETECTED 06/06/2011 0416   COCAINSCRNUR NONE DETECTED 06/06/2011 0416   LABBENZ NONE DETECTED 06/06/2011 0416   AMPHETMU NONE DETECTED 06/06/2011 0416   THCU POSITIVE* 06/06/2011 0416   LABBARB NONE DETECTED 06/06/2011 0416    Alcohol Level No results found for this basename: eth     Dg Chest 2 View  06/06/2011  *RADIOLOGY REPORT*  Clinical Data: CVA  CHEST - 2 VIEW  Comparison: None.  Findings: Heart size upper normal limits to mildly enlarged.  Mild tortuosity to the thoracic aorta.  Bibasilar opacities are linear, favoring atelectasis or scarring.  No acute osseous abnormality.  IMPRESSION: Heart size upper normal limits to mildly enlarged.  Linear lung base opacities favor scarring or atelectasis.  Original Report Authenticated By: Waneta Martins, M.D.   Ct Head Wo Contrast  06/05/2011  *RADIOLOGY REPORT*  Clinical Data: Code stroke.  Right upper extremity weakness. Expressive aphasia.  CT HEAD WITHOUT CONTRAST 06/13/2011:  Technique:  Contiguous axial images were obtained from the base of the skull through the vertex without contrast.  Comparison: Unenhanced CT head 2 days ago, 06/03/2011.  Findings: Patient motion blurred many of the images on the current examination; these were repeated and a diagnostic study was obtained.  Ventricular system normal in size and appearance for age.  No  evidence of interval stroke development.  Severe chronic microvascular ischemic changes of the white matter.  Cavum septum pellucidum.  No mass lesion.  No midline shift.  No acute hemorrhage or hematoma.  No extra-axial fluid collections.  No evidence of acute infarction.  No significant interval change.  No focal osseous abnormality involving the skull.  Visualized paranasal sinuses, mastoid air cells, and middle ear cavities well- aerated.  IMPRESSION:  1.  No acute intracranial abnormality. 2.  Severe chronic microvascular ischemic changes of the white matter diffusely. 3.  No evidence of interval stroke development  since the examination 2 days ago.  These results were called by telephone on 06/05/2011  at  1930 hours to  Dr. Terressa Koyanagi of the emergency department, who verbally acknowledged these results.  Original Report Authenticated By: Arnell Sieving, M.D.    CT of the brain:  No acute abnormality. Extensive changes of chronic microvascular ischemia. MRI of the brain:  patchy left middle cerebral artery infarct involving insular cortex, left frontal and parietal lobes. MRA of the brain: abrupt occlusion of the M2 segment of the left middle cerebral artery at the origin of the inferior division  2D Echocardiogram: pending  Carotid Doppler:  Pending    EKG:  Normal ASSESSMENT [name] is a   60 y.o. male Who had sudden onset of aphasia and language difficulties with fluctuating exam and rapid improvement. 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 Due to worsening of his symptoms and presence of left M2 occlusion he was randomized into the DIAS-IV trial.   Stroke risk factors:  hypertension, Smoking  Hospital day # 1  TREATMENT/PLAN Continue aspirin 325 mg orally every day  for stroke prevention.  Repeat CT scan had known today. Physical, occupational and speech therapy consults for swallowing and cognition. Mobilize out of bed. Check echocardiogram, carotid Dopplers, hemoglobin A1c. Smoking cessation counseling. Will I will meet with family later to discuss his care.  Joaquin Music, ANP-BC, GNP-BC [facility] Pager: [phone] 06/06/2011 8:32 AM  Dr. Delia Heady, Stroke Center Medical Director, has personally reviewed chart, pertinent data, examined the patient and developed the plan of care.

## 2011-06-07 LAB — LIPID PANEL
Cholesterol: 190 mg/dL (ref 0–200)
Total CHOL/HDL Ratio: 4 RATIO

## 2011-06-07 LAB — URINALYSIS, DIPSTICK ONLY
Bilirubin Urine: NEGATIVE
Glucose, UA: NEGATIVE mg/dL
Hgb urine dipstick: NEGATIVE
Ketones, ur: NEGATIVE mg/dL
Nitrite: NEGATIVE
pH: 6.5 (ref 5.0–8.0)

## 2011-06-07 LAB — GAMMA GT: GGT: 42 U/L (ref 7–51)

## 2011-06-07 LAB — FIBRINOGEN
Fibrinogen: 357 mg/dL (ref 204–475)
Fibrinogen: 386 mg/dL (ref 204–475)

## 2011-06-07 MED ORDER — ENOXAPARIN SODIUM 40 MG/0.4ML ~~LOC~~ SOLN
40.0000 mg | SUBCUTANEOUS | Status: DC
Start: 2011-06-07 — End: 2011-06-11
  Administered 2011-06-07 – 2011-06-11 (×5): 40 mg via SUBCUTANEOUS
  Filled 2011-06-07 (×5): qty 0.4

## 2011-06-07 NOTE — Progress Notes (Signed)
[name] family requested a social work consult for information on applying for disability. PT/OT/SLP are recommending CIR at this time. CSW provided information on SNF, as pt is "self pay." CSW provided requested information. For assessment, please see pt's chart. CSW will continue to follow during this admission.   Dede Query, MSW, Theresia Majors [phone]

## 2011-06-07 NOTE — Progress Notes (Signed)
Speech Language/Pathology Speech Language Pathology Evaluation Patient Details Name: Carl Gilbert MRN: [id number] DOB: 1951-03-13 Today's Date: 06/07/2011  Problem List:  Patient Active Problem List  Diagnoses  . Unspecified cerebral artery occlusion with cerebral infarction  . Hypertension  . Smoking addiction  . Aphasia complicating stroke    Past Medical History:  Past Medical History  Diagnosis Date  . Hypertension    Past Surgical History: History reviewed. No pertinent past surgical history.  SLP Assessment/Plan/Recommendation Assessment Clinical Impression Statement: Pt presents with a moderate expressive and receptive aphasia.  Pt requires max visual cues to comprehend basic language.  Expressive language characterized by paragrammatic perseverative phrases and phonemic paraphasias. The pt has some automatic expression of wants and needs.  Functional comprehension is significantly impaired.  Pt would benefit from acute therapy to improve functional communication and awareness of deficits.  Pt will benefit from CIR consult.  SLP Recommendation/Assessment: Patient will need skilled Speech Lanaguage Pathology Services in the acute care venue to address identified deficits Problem List: Auditory comprehension;Reading comprehension;Written expression;Verbal expression;Problem Solving Therapy Diagnosis: Aphasia Type of Aphasia: Unable to differentiate diagnosis Plan Speech Therapy Frequency: min 2x/week Duration: 2 weeks Treatment/Interventions: Language facilitation;Cueing hierarchy;Functional tasks;Multimodal communcation approach;SLP instruction and feedback;Compensatory strategies;Patient/family education Potential to Achieve Goals: Good Recommendation Recommendations for Other Services: Rehab consult Follow up Recommendations: Inpatient Rehab Equipment Recommended: Defer to next venue Individuals Consulted Consulted and Agree with Results and Recommendations: Family  member/caregiver Family Member Consulted : wife, sister  SLP Goals  Pt will express basic wants needs with moderate visual/verbal cues x5.  Pt will follow 1 step commands with moderate verbal/visual cues x 5 Pt will demonstrate emergent awareness of expressive deficits during functional communication task with moderate multimodal cues.  Harlon Ditty, [place] CCC-SLP [phone]  Carl Gilbert 06/07/2011, 1:38 PM

## 2011-06-07 NOTE — Progress Notes (Signed)
SUBJECTIVE  Carl Gilbert is a 60 y.o. male whose symptoms occurred 2 days ago. He has residual symptoms of aphasia. Overall he feels his condition is unchanged.  He continues to have significant difficulties with expressive as well as receptive language. He has not had any neurological changes. He had a followup CT scan done yesterday afternoon which showed expected evolutionary changes without any hemorrhage. His blood pressure has remained quite stable. Family is not available at the bedside this morning  OBJECTIVE Most recent Vital Signs: Temp: 98.7 F (37.1 C) (11/23 0728) Temp src: Oral (11/23 0728) BP: 144/82 mmHg (11/23 0900) Pulse Rate: 71  (11/23 0900) Respiratory Rate: 19 O2 Saturdation: 99%  CBG (last 3)   Basename 06/05/11 1911  GLUCAP 107*   Intake/Output from previous day: 11/22 0701 - 11/23 0700 In: 2540 [P.O.:240; I.V.:2300] Out: 750 [Urine:750]  IV Fluid Intake:     . sodium chloride 100 mL/hr at 06/06/11 1800   Diet:  regular Activity:  Up with assistance  DVT Prophylaxis:  lovenox Studies: CBC    Component Value Date/Time   WBC 11.4* 06/06/2011 1000   RBC 4.92 06/06/2011 1000   HGB 14.2 06/06/2011 1000   HCT 41.9 06/06/2011 1000   PLT 185 06/06/2011 1000   MCV 85.2 06/06/2011 1000   MCH 28.9 06/06/2011 1000   MCHC 33.9 06/06/2011 1000   RDW 14.4 06/06/2011 1000   CMP    Component Value Date/Time   NA 138 06/06/2011 1000   K 3.6 06/06/2011 1000   CL 104 06/06/2011 1000   CO2 24 06/06/2011 1000   GLUCOSE 129* 06/06/2011 1000   BUN 13 06/06/2011 1000   CREATININE 0.92 06/06/2011 1000   CALCIUM 8.9 06/06/2011 1000   PROT 7.3 06/06/2011 1000   ALBUMIN 3.6 06/06/2011 1000   AST 14 06/06/2011 1000   ALT 15 06/06/2011 1000   ALKPHOS 127* 06/06/2011 1000   BILITOT 0.4 06/06/2011 1000   GFRNONAA 90* 06/06/2011 1000   GFRAA >90 06/06/2011 1000   COAGS Lab Results  Component Value Date   INR 1.01 06/06/2011   INR 0.97 06/05/2011   Lipid  Panel    Component Value Date/Time   CHOL 159 06/06/2011 0445   TRIG 89 06/06/2011 0445   HDL 43 06/06/2011 0445   CHOLHDL 3.7 06/06/2011 0445   VLDL 18 06/06/2011 0445   LDLCALC 98 06/06/2011 0445   HgbA1C  Lab Results  Component Value Date   HGBA1C 5.5 06/06/2011   Urine Drug Screen    Component Value Date/Time   LABOPIA NONE DETECTED 06/06/2011 0416   COCAINSCRNUR NONE DETECTED 06/06/2011 0416   LABBENZ NONE DETECTED 06/06/2011 0416   AMPHETMU NONE DETECTED 06/06/2011 0416   THCU POSITIVE* 06/06/2011 0416   LABBARB NONE DETECTED 06/06/2011 0416    Alcohol Level No results found for this basename: eth     Ct Angio Head W/cm &/or Wo Cm  06/06/2011  *RADIOLOGY REPORT*  Clinical Data:  60 year old male Code stroke. Left MCA stroke. DIAS 4 study.  CT ANGIOGRAPHY HEAD  Technique:  Multidetector CT imaging of the head was performed using the standard protocol during bolus administration of intravenous contrast.  Multiplanar CT image reconstructions including MIPs were obtained to evaluate the vascular anatomy.  Contrast: 50mL OMNIPAQUE IOHEXOL 350 MG/ML IV SOLN  Comparison:  Brain MRI/MRA from 4 hours earlier.  Findings:  Widespread and confluent cerebral white matter hypodensity corresponding to the chronic findings on the MRI. Subtle areas of cortical hypodensity  related to the acute ischemia, including at the left motor strip at the vertex (series 8 image 18). No midline shift, mass effect, or evidence of mass lesion.  No acute intracranial hemorrhage identified.  No ventriculomegaly. No abnormal enhancement identified.  No acute osseous abnormality identified.  Visualized paranasal sinuses and mastoids are clear.  Visualized orbits and scalp soft tissues are within normal limits.  Vascular Findings: Major intracranial venous structures are enhancing.  Dominant distal left vertebral artery with normal left PICA. Diminutive distal right vertebral artery with patent right PICA. Tortuous  basilar artery without stenosis.  Normal superior cerebellar and PCA origins.  Normal right posterior communicating artery.  Again, suspect decreased flow in the left posterior communicating artery (series 604 image 48).  Bilateral PCA branches are within normal limits.  Distal cervical ICA is are patent.  Bilateral ICA siphons are patent without stenosis.  Normal ophthalmic and right posterior communicating artery origins.  Left posterior communicating artery origin less conspicuous but grossly normal.  Patent carotid termini.  Dominant right ACA A1 segment.  Normal anterior communicating artery.  ACA branches are within normal limits.  Normal right MCA origin.  Minimal irregularity of the right M1 segment.  Patent right MCA bifurcation.  Right MCA branches are within normal limits.  Left MCA origin is patent.  Minimal to mild irregularity of the M1 segment which remains patent.  Stable findings of left MCA posterior sylvian division occlusion.  The anterior sylvian division remains patent.  There is some reconstitution of flow in the posterior M2 segments as seen on MIP series 606 image 25.  This is approximately 3 cm beyond the point of occlusion.  Still there is significantly diminished posterior left MCA flow compared to the right (series 607).   Review of the MIP images confirms the above findings.  IMPRESSION: 1.  Stable findings of left MCA major posterior M2 branch occlusion.  There is some reconstitution of flow or flow from collaterals in the posterior left MCA territory. 2.  I continue to suspect decreased flow in the left posterior communicating artery, but left PCA flow is otherwise maintained. 3.  No other intracranial stenosis or occlusion. 4.  Early CT changes of the scattered left hemisphere infarcts, predominately in the left MCA territory, as seen on the earlier MRI.  No mass effect or hemorrhage. 5.  No new intracranial abnormality.  A preliminary report without discrepancy to the above was  issued by Dr. Lanell Matar at 0005 hours on 06/06/2011.  Original Report Authenticated By: Harley Hallmark, M.D.   Dg Chest 2 View  06/06/2011  *RADIOLOGY REPORT*  Clinical Data: CVA  CHEST - 2 VIEW  Comparison: None.  Findings: Heart size upper normal limits to mildly enlarged.  Mild tortuosity to the thoracic aorta.  Bibasilar opacities are linear, favoring atelectasis or scarring.  No acute osseous abnormality.  IMPRESSION: Heart size upper normal limits to mildly enlarged.  Linear lung base opacities favor scarring or atelectasis.  Original Report Authenticated By: Waneta Martins, M.D.   Ct Head Wo Contrast  06/06/2011  *RADIOLOGY REPORT*  Clinical Data: 60 year old male Code stroke, left MCA infarcts.  CT HEAD WITHOUT CONTRAST  Technique:  Contiguous axial images were obtained from the base of the skull through the vertex without contrast.  Comparison: CTA head, MRI/MRA head 11/22 and 06/05/2011.  Findings: Continued hyperdensity in the left MCA posterior sylvian division.  Expected evolution of cytotoxic edema in the scattered left frontal and parietal lobe cortex seemed  be affected on the comparison MRI. Study is intermittently degraded by motion artifact despite repeated imaging attempts.  Underlying confluent bilateral cerebral white matter hypodensity. No mass effect or acute intracranial hemorrhage.  No ventriculomegaly.  Stable osseous structures, paranasal sinuses, orbits and scalp soft tissues.  IMPRESSION: 1.  Expected evolution of left MCA infarcts without mass effect or hemorrhage. 2.  Underlying advanced cerebral white matter disease. 3.  No new intracranial abnormality.  Original Report Authenticated By: Harley Hallmark, M.D.   Ct Head Wo Contrast  06/05/2011  *RADIOLOGY REPORT*  Clinical Data: Code stroke.  Right upper extremity weakness. Expressive aphasia.  CT HEAD WITHOUT CONTRAST 06/13/2011:  Technique:  Contiguous axial images were obtained from the base of the skull through the  vertex without contrast.  Comparison: Unenhanced CT head 2 days ago, 06/03/2011.  Findings: Patient motion blurred many of the images on the current examination; these were repeated and a diagnostic study was obtained.  Ventricular system normal in size and appearance for age.  No evidence of interval stroke development.  Severe chronic microvascular ischemic changes of the white matter.  Cavum septum pellucidum.  No mass lesion.  No midline shift.  No acute hemorrhage or hematoma.  No extra-axial fluid collections.  No evidence of acute infarction.  No significant interval change.  No focal osseous abnormality involving the skull.  Visualized paranasal sinuses, mastoid air cells, and middle ear cavities well- aerated.  IMPRESSION:  1.  No acute intracranial abnormality. 2.  Severe chronic microvascular ischemic changes of the white matter diffusely. 3.  No evidence of interval stroke development since the examination 2 days ago.  These results were called by telephone on 06/05/2011  at  1930 hours to  Dr. Terressa Koyanagi of the emergency department, who verbally acknowledged these results.  Original Report Authenticated By: Arnell Sieving, M.D.   Mr Angiogram Head Wo Contrast  06/06/2011  *RADIOLOGY REPORT*  Clinical Data:  60 year old male, Code stroke.  Slurred speech.  Comparison: Head CTs 06/05/2011 and earlier.  MRI HEAD WITHOUT CONTRAST  Technique: Multiplanar, multiecho pulse sequences of the brain and surrounding structures were obtained according to standard protocol without intravenous contrast.  Findings: Scattered areas of cortical, subcortical white matter, and central white matter infarcts in the left hemisphere.  The posterior insula, operculum, left parietal lobe, left occipital pole, and superior periRolandic cortex are affected (series 4).  No associated mass effect or hemorrhage.  T2 and FLAIR hyperintensity has not yet developed in these areas.  There is superimposed confluent bilateral  cerebral white matter T2 and FLAIR signal abnormality suggestive of chronic small vessel disease.  The deep gray matter nuclei, brainstem and cerebellum relatively spared.  Axial FLAIR images suggest poor flow in the left MCA posterior sylvian division (series 7 image 13).  Flow voids in this area also are lost on axial T2 images.  Other Major intracranial vascular flow voids are preserved with dominant left vertebral artery suspected.  No ventriculomegaly. No midline shift, mass effect, or evidence of mass lesion.  No acute intracranial hemorrhage identified. Negative pituitary and cervicomedullary junction.  Mild degenerative cervical spinal stenosis at C3-C4.  Visualized orbit soft tissues are within normal limits.  Visualized paranasal sinuses and mastoids are clear.  Mild bone marrow heterogeneity, but overall within normal limits. Visualized scalp soft tissues are within normal limits.  IMPRESSION: 1.  Acute infarct in the posterior left MCA, and  occasionally left PCA, territories.  No mass effect or hemorrhage. 2.  Associated  abnormality of the left MCA posterior M2 branches, see MRA findings below. 3.  Underlying advanced cerebral white matter signal changes, favor chronic small vessel disease.  MRA HEAD WITHOUT CONTRAST  Technique: Angiographic images of the Circle of Willis were obtained using MRA technique without  intravenous contrast.  Findings: Antegrade flow in the posterior circulation with dominant distal left vertebral artery.  Diminutive distal right vertebral artery.  Normal bilateral PICA vessels.  Tortuous but otherwise normal vertebrobasilar junction and basilar artery.  Superior cerebellar arteries and PCA origins are within normal limits. Bilateral posterior communicating arteries are present, flow appears diminished in the left (series 5 image 95).  Bilateral PCA flow is within normal limits.  Antegrade flow in both ICA siphons.  Flow signal in the distal cervical left ICA appears mildly  decreased compared to that on the right.  No ICA siphon stenosis.  Ophthalmic and posterior communicating artery origins are within normal limits.  Carotid termini are patent.  The right ACA A1 segment is dominant.  Anterior communicating artery and visualized ACA branches are normal except for mild irregularity.  The right MCA origin and M1 segments are within normal limits.  Right MCA branches are within normal limits.  The left MCA origin and M1 segment are patent.  There is mild irregularity of the distal M1.  At the left MCA bifurcation the dominant posterior sylvian division is occluded just beyond its origin.  The anterior division remains patent.  There is a small infundibulum suspected at the origin of one of its early branches. There is little flow signal in the posterior left MCA territory.  IMPRESSION: 1.  Left MCA posterior M2 branch occlusion just beyond its origin. Little flow signal in the posterior left MCA territory. 2.  Questionable asymmetrically decreased flow in the distal cervical left ICA, such as due to a cervical left ICA stenosis. 3.  Suggestion of decreased left posterior communicating artery flow, may account for the left PCA findings on the above MRI.  No PCA branch stenosis or occlusion.  Salient findings on this study were discussed by telephone with Dr. April Palumbo-Rasch on 06/05/2011 at 2112 hours, and a preliminary report without discrepancy to the above was annotated at 2111 hours.  Original Report Authenticated By: Harley Hallmark, M.D.   Mr Brain Wo Contrast  06/06/2011  *RADIOLOGY REPORT*  Clinical Data:  60 year old male, Code stroke.  Slurred speech.  Comparison: Head CTs 06/05/2011 and earlier.  MRI HEAD WITHOUT CONTRAST  Technique: Multiplanar, multiecho pulse sequences of the brain and surrounding structures were obtained according to standard protocol without intravenous contrast.  Findings: Scattered areas of cortical, subcortical white matter, and central white  matter infarcts in the left hemisphere.  The posterior insula, operculum, left parietal lobe, left occipital pole, and superior periRolandic cortex are affected (series 4).  No associated mass effect or hemorrhage.  T2 and FLAIR hyperintensity has not yet developed in these areas.  There is superimposed confluent bilateral cerebral white matter T2 and FLAIR signal abnormality suggestive of chronic small vessel disease.  The deep gray matter nuclei, brainstem and cerebellum relatively spared.  Axial FLAIR images suggest poor flow in the left MCA posterior sylvian division (series 7 image 13).  Flow voids in this area also are lost on axial T2 images.  Other Major intracranial vascular flow voids are preserved with dominant left vertebral artery suspected.  No ventriculomegaly. No midline shift, mass effect, or evidence of mass lesion.  No acute intracranial hemorrhage identified. Negative  pituitary and cervicomedullary junction.  Mild degenerative cervical spinal stenosis at C3-C4.  Visualized orbit soft tissues are within normal limits.  Visualized paranasal sinuses and mastoids are clear.  Mild bone marrow heterogeneity, but overall within normal limits. Visualized scalp soft tissues are within normal limits.  IMPRESSION: 1.  Acute infarct in the posterior left MCA, and  occasionally left PCA, territories.  No mass effect or hemorrhage. 2.  Associated abnormality of the left MCA posterior M2 branches, see MRA findings below. 3.  Underlying advanced cerebral white matter signal changes, favor chronic small vessel disease.  MRA HEAD WITHOUT CONTRAST  Technique: Angiographic images of the Circle of Willis were obtained using MRA technique without  intravenous contrast.  Findings: Antegrade flow in the posterior circulation with dominant distal left vertebral artery.  Diminutive distal right vertebral artery.  Normal bilateral PICA vessels.  Tortuous but otherwise normal vertebrobasilar junction and basilar artery.   Superior cerebellar arteries and PCA origins are within normal limits. Bilateral posterior communicating arteries are present, flow appears diminished in the left (series 5 image 95).  Bilateral PCA flow is within normal limits.  Antegrade flow in both ICA siphons.  Flow signal in the distal cervical left ICA appears mildly decreased compared to that on the right.  No ICA siphon stenosis.  Ophthalmic and posterior communicating artery origins are within normal limits.  Carotid termini are patent.  The right ACA A1 segment is dominant.  Anterior communicating artery and visualized ACA branches are normal except for mild irregularity.  The right MCA origin and M1 segments are within normal limits.  Right MCA branches are within normal limits.  The left MCA origin and M1 segment are patent.  There is mild irregularity of the distal M1.  At the left MCA bifurcation the dominant posterior sylvian division is occluded just beyond its origin.  The anterior division remains patent.  There is a small infundibulum suspected at the origin of one of its early branches. There is little flow signal in the posterior left MCA territory.  IMPRESSION: 1.  Left MCA posterior M2 branch occlusion just beyond its origin. Little flow signal in the posterior left MCA territory. 2.  Questionable asymmetrically decreased flow in the distal cervical left ICA, such as due to a cervical left ICA stenosis. 3.  Suggestion of decreased left posterior communicating artery flow, may account for the left PCA findings on the above MRI.  No PCA branch stenosis or occlusion.  Salient findings on this study were discussed by telephone with Dr. April Palumbo-Rasch on 06/05/2011 at 2112 hours, and a preliminary report without discrepancy to the above was annotated at 2111 hours.  Original Report Authenticated By: Harley Hallmark, M.D.   Dg Chest Portable 1 View  06/06/2011  *RADIOLOGY REPORT*  Clinical Data: Acute stroke.  PORTABLE CHEST - 1 VIEW   Comparison: 06/06/2011  Findings: Decrease in mild bibasilar atelectasis is seen since previous study.  No evidence of pulmonary consolidation or edema. No evidence of pleural effusion.  Heart size is within normal limits.  IMPRESSION: Decreased bibasilar atelectasis.  No new or progressive disease.  Original Report Authenticated By: Danae Orleans, M.D.     2D Echocardiogram:  Ejection fraction 60-65%. No cardiac source of embolism Carotid Doppler:  Prelim no significant stenosis   Physical Exam:   A febrile. Vital signs stable. Distal pulses are felt. Head is nontraumatic without bruit.  Cardiac exam no murmur no gallop. Regular heart sounds. Lungs are clear to auscultation.  Neurologic: Awake alert aphasic. Nonfluent speech with significant paraphasic errors and word finding difficulties. Able to follow midline and simple one-step commands only. Unable to repeat can name only a few simple objects. Eye movements are full range without nystagmus. Blinks to threat bilaterally. There is right visual neglect and inattention. No facial weakness. Tongue is midline. He is unable to tell me the month and his age. Motor system exam reveals no upper or lower extremity drift. Symmetric and equal strength not 4 extremities. Finger to nose and knee tibial cognition are accurate. Withdraws to painful stimuli in all 4 extremities equally. Plantars are downgoing.  NIH stroke scale is 5    ASSESSMENT [name] is a 60 y.o. male with a embolic left MCA infarct, secondary to left M2 occlusion, on aspirin 325 mg orally every day for secondary stroke prevention.Patient has participated in that DIAS 4 stroke trial  Stroke risk factors:  hypertension  Hospital day # 2  TREATMENT/PLAN Continue aspirin 325 mg orally every day for stroke prevention.  Mobilize out of bed. Physical occupational therapy consults. Speech therapy for language. Transfer to 3000. He will need transesophageal echocardiogram and  outpatient prolonged cardiac telemetry for paroxysmal atrial fibrillation  Gates Rigg, MD [facility] Pager: [phone] 06/07/2011 9:27 AM

## 2011-06-07 NOTE — Progress Notes (Signed)
Occupational Therapy Evaluation Patient Details Name: Carl Gilbert MRN: [id number] DOB: 1951-01-09 Today's Date: 06/07/2011  Problem List:  Patient Active Problem List  Diagnoses  . Unspecified cerebral artery occlusion with cerebral infarction  . Hypertension  . Smoking addiction  . Aphasia complicating stroke    Past Medical History:  Past Medical History  Diagnosis Date  . Hypertension    Past Surgical History: History reviewed. No pertinent past surgical history.  OT Assessment/Plan/Recommendation OT Assessment Clinical Impression Statement: Patient admitted with MCA and parietal CVA. Will benefit from skilled OT in the acute setting to maximinze independence with ADL and ADL moblity to Mod I/S level upon d/c home with wife. OT Recommendation/Assessment: Patient will need skilled OT in the acute care venue OT Problem List: Decreased strength;Impaired balance (sitting and/or standing);Impaired vision/perception;Decreased coordination;Decreased cognition;Decreased safety awareness;Decreased knowledge of use of DME or AE;Decreased knowledge of precautions;Impaired sensation OT Therapy Diagnosis : Generalized weakness;Cognitive deficits;Disturbance of vision;Paresis OT Plan OT Frequency: Min 2X/week OT Treatment/Interventions: Self-care/ADL training;Therapeutic exercise;Neuromuscular education;DME and/or AE instruction;Manual therapy;Patient/family education;Balance training;Visual/perceptual remediation/compensation;Cognitive remediation/compensation;Therapeutic activities OT Recommendation Recommendations for Other Services: Rehab consult Follow Up Recommendations: Inpatient Rehab Equipment Recommended: Defer to next venue Individuals Consulted Consulted and Agree with Results and Recommendations: Patient unable/family or caregiver not available OT Goals Acute Rehab OT Goals OT Goal Formulation: With patient Time For Goal Achievement: 7 days ADL Goals Pt Will Perform  Grooming: Independently;Standing at sink ADL Goal: Grooming - Progress: Other (comment) Pt Will Perform Upper Body Dressing: Independently;Sitting, bed ADL Goal: Upper Body Dressing - Progress: Other (comment) Pt Will Perform Lower Body Dressing: Independently;Sit to stand from bed ADL Goal: Lower Body Dressing - Progress: Other (comment) Pt Will Transfer to Toilet: with supervision;Ambulation;Regular height toilet ADL Goal: Toilet Transfer - Progress: Other (comment) Pt Will Perform Toileting - Clothing Manipulation: Independently;Standing ADL Goal: Toileting - Clothing Manipulation - Progress: Other (comment) Pt Will Perform Toileting - Hygiene: Independently;Sitting on 3-in-1 or toilet ADL Goal: Toileting - Hygiene - Progress: Other (comment) Pt Will Perform Tub/Shower Transfer: with supervision;Ambulation ADL Goal: Tub/Shower Transfer - Progress: Other (comment)  OT Evaluation Precautions/Restrictions  Precautions Precautions: Fall Required Braces or Orthoses: No Restrictions Weight Bearing Restrictions: No Prior Functioning Home Living Lives With: Spouse (pt aphasic and unable to explain home environment) Prior Function Comments: unknown due to aphasia; pt inaccurate with yes/no biographical ?'s ADL ADL Toilet Transfer: Simulated (Min guard A for sit to stand from EOB) Toileting - Clothing Manipulation: Minimal assistance Where Assessed - Toileting Clothing Manipulation:  (sit to stand from EOB) ADL Comments: Mod assist with ambulation- patient dragging Rt foot and scissoring- unable to provide VC to improve secondary to aphasia Vision/Perception  Vision - Assessment Additional Comments: Difficult to assess secondary to cognition- Functionally tracking Lt and Rt, appears to have Lt gaze preference.  Cognition Cognition Arousal/Alertness: Awake/alert Overall Cognitive Status: Difficult to assess Difficult to assess due to: impaired communication Orientation Level:  Oriented to place;Oriented to person Sensation/Coordination Sensation Light Touch: Impaired by gross assessment Additional Comments: No withdrawl/grimacing to pain on RLE Coordination Gross Motor Movements are Fluid and Coordinated: No (right) Fine Motor Movements are Fluid and Coordinated: No (right) Coordination and Movement Description: RLE weakness noted with movement Extremity Assessment RUE Assessment RUE Assessment: Exceptions to [facility] RUE AROM (degrees) Overall AROM Right Upper Extremity: Within functional limits for tasks performed RUE Strength RUE Overall Strength:  (4+/5) LUE Assessment LUE Assessment: Within Functional Limits Mobility  Bed Mobility Bed Mobility: Yes Supine to Sit: 5: Supervision  Supine to Sit Details (indicate cue type and reason): patient not attending to RUE- left twisted at side and required assist to reposition Sitting - Scoot to Edge of Bed: 6: Modified independent (Device/Increase time) Sit to Supine - Right: 6: Modified independent (Device/Increase time);HOB flat Transfers Sit to Stand: From bed;From chair/3-in-1;With armrests (Min guard A) Sit to Stand Details (indicate cue type and reason): min-guard A for safety due to RLE weakness Stand to Sit: 4: Min assist;To chair/3-in-1;To bed Stand to Sit Details: as above Exercises   End of Session OT - End of Session Equipment Utilized During Treatment: Gait belt Activity Tolerance: Patient tolerated treatment well Patient left: in bed Nurse Communication: Mobility status for transfers;Mobility status for ambulation General Behavior During Session: [facility] for tasks performed Cognition: [facility] for tasks performed Cognitive Impairment: Patient with expressive and receptive? difficulties/aphasia?   Carl Gilbert 06/07/2011, 10:47 AM

## 2011-06-07 NOTE — Progress Notes (Signed)
Physical Therapy Evaluation Patient Details Name: Carl Gilbert MRN: [id number] DOB: 26-Aug-1950 Today's Date: 06/07/2011  Problem List:  Patient Active Problem List  Diagnoses  . Unspecified cerebral artery occlusion with cerebral infarction  . Hypertension  . Smoking addiction  . Aphasia complicating stroke    Past Medical History:  Past Medical History  Diagnosis Date  . Hypertension    Past Surgical History: History reviewed. No pertinent past surgical history.  PT Assessment/Plan/Recommendation PT Assessment Clinical Impression Statement: Pt s/p Lt MCA infarct with resultant dependencies in mobility due to the deficits outlined below.  Pt will benefit from PT to maximize independence prior to d/c home with ? family support (degree of support currently unknown). PT Recommendation/Assessment: Patient will need skilled PT in the acute care venue PT Problem List: Decreased strength;Decreased balance;Decreased mobility;Decreased knowledge of use of DME;Other (comment);Impaired sensation (decr awareness of deficits) Barriers to Discharge: Other (comment) Barriers to Discharge Comments: unknown caregiver support PT Therapy Diagnosis : Hemiplegia dominant side;Abnormality of gait PT Plan PT Frequency: Min 4X/week PT Treatment/Interventions: DME instruction;Gait training;Functional mobility training;Balance training;Neuromuscular re-education;Patient/family education PT Recommendation Recommendations for Other Services: Rehab consult Follow Up Recommendations: Inpatient Rehab;24 hour supervision/assistance Equipment Recommended: Defer to next venue PT Goals  Acute Rehab PT Goals PT Goal Formulation: Patient unable to participate in goal setting Time For Goal Achievement: 7 days Pt will Roll Supine to Right Side: Independently PT Goal: Rolling Supine to Right Side - Progress: Other (comment) Pt will Roll Supine to Left Side: Independently PT Goal: Rolling Supine to Left Side -  Progress: Other (comment) Pt will go Supine/Side to Sit: Independently PT Goal: Supine/Side to Sit - Progress: Other (comment) Pt will go Sit to Supine/Side: Independently PT Goal: Sit to Supine/Side - Progress: Other (comment) Pt will Ambulate: 51 - 150 feet;with supervision;with least restrictive assistive device;with cues (comment type and amount) (vc to attend to Rt side) PT Goal: Ambulate - Progress: Other (comment) Additional Goals Additional Goal #1: Pt will perform dynamic standing activities with supervision for safety with ability to reach > 8 inches outside BOS PT Goal: Additional Goal #1 - Progress: Other (comment)  PT Evaluation Precautions/Restrictions  Precautions Precautions: Fall Required Braces or Orthoses: No Restrictions Weight Bearing Restrictions: No Prior Functioning  Home Living Lives With: Spouse (pt aphasic and unable to explain home environment) Prior Function Comments: unknown due to aphasia; pt inaccurate with yes/no biographical ?'s Cognition Cognition Arousal/Alertness: Awake/alert Overall Cognitive Status: Difficult to assess Difficult to assess due to: impaired communication Orientation Level: Oriented to place;Oriented to person Sensation/Coordination Sensation Light Touch: Impaired by gross assessment Additional Comments: No withdrawl/grimacing to pain on RLE Coordination Gross Motor Movements are Fluid and Coordinated: No (right) Fine Motor Movements are Fluid and Coordinated: No (right) Coordination and Movement Description: RLE weakness noted with movement Extremity Assessment RUE Assessment RUE Assessment: Exceptions to [facility] RUE AROM (degrees) Overall AROM Right Upper Extremity: Within functional limits for tasks performed RUE Strength RUE Overall Strength:  (4+/5) LUE Assessment LUE Assessment: Within Functional Limits RLE Assessment RLE Assessment: Exceptions to [facility] RLE AROM (degrees) Overall AROM Right Lower Extremity: Within  functional limits for tasks assessed RLE Strength RLE Overall Strength: Deficits RLE Overall Strength Comments: difficult to fully assess due to aphasia and decr following commands (even visual); grossly 4/5 LLE Assessment LLE Assessment: Within Functional Limits Mobility (including Balance) Bed Mobility Bed Mobility: Yes Supine to Sit: 5: Supervision Supine to Sit Details (indicate cue type and reason): patient not attending to RUE- left  twisted at side and required assist to reposition Sitting - Scoot to Edge of Bed: 6: Modified independent (Device/Increase time) Sit to Supine - Right: 6: Modified independent (Device/Increase time);HOB flat Transfers Transfers: Yes Sit to Stand: From bed;From chair/3-in-1;With armrests (Min guard A) Sit to Stand Details (indicate cue type and reason): min-guard A for safety due to RLE weakness Stand to Sit: 4: Min assist;To chair/3-in-1;To bed Stand to Sit Details: as above Ambulation/Gait Ambulation/Gait: Yes Ambulation/Gait Assistance: 3: Mod assist Ambulation/Gait Assistance Details (indicate cue type and reason): poor clearance RLE, visual cues to lift RLE resulted in pt adducting with near scissoring of RLE; decr attention to RLE appears more problematic than true weakness Ambulation Distance (Feet): 10 Feet (seated rest and then ambulated 45 ft) Assistive device: 1 person hand held assist Gait Pattern: Decreased step length - right;Decreased stance time - right;Decreased hip/knee flexion - right;Scissoring Stairs: No  Posture/Postural Control Posture/Postural Control: Postural limitations Balance Balance Assessed: Yes Static Sitting Balance Static Sitting - Balance Support: No upper extremity supported;Feet supported Static Sitting - Level of Assistance: 7: Independent Static Standing Balance Static Standing - Balance Support: No upper extremity supported Static Standing - Level of Assistance: 5: Stand by assistance Exercise    End of  Session PT - End of Session Equipment Utilized During Treatment: Gait belt Activity Tolerance: Patient tolerated treatment well Patient left: in bed;with call bell in reach Nurse Communication: Mobility status for ambulation (discussed pt's degree of aphasia incl. yes/no inaccurate) General Behavior During Session: [facility] for tasks performed Cognition: [facility] for tasks performed Cognitive Impairment: Patient with expressive and receptive? difficulties/aphasia?  Shaindel Sweeten 06/07/2011, 10:52 AM Pager [phone]

## 2011-06-08 LAB — CBC
Hemoglobin: 15.4 g/dL (ref 13.0–17.0)
MCH: 28.4 pg (ref 26.0–34.0)
MCV: 83.8 fL (ref 78.0–100.0)
RBC: 5.42 MIL/uL (ref 4.22–5.81)
WBC: 12.5 10*3/uL — ABNORMAL HIGH (ref 4.0–10.5)

## 2011-06-08 LAB — COMPREHENSIVE METABOLIC PANEL
ALT: 14 U/L (ref 0–53)
AST: 15 U/L (ref 0–37)
CO2: 21 mEq/L (ref 19–32)
Calcium: 9.3 mg/dL (ref 8.4–10.5)
Chloride: 101 mEq/L (ref 96–112)
GFR calc Af Amer: >90 mL/min (ref >90)
GFR calc non Af Amer: >90 mL/min (ref >90)
Glucose, Bld: 79 mg/dL (ref 70–99)
Sodium: 133 mEq/L — ABNORMAL LOW (ref 135–145)
Total Bilirubin: 0.3 mg/dL (ref 0.3–1.2)

## 2011-06-08 LAB — URINALYSIS, DIPSTICK ONLY
Nitrite: NEGATIVE
Specific Gravity, Urine: 1.016 (ref 1.005–1.030)
Urobilinogen, UA: 1 mg/dL (ref 0.0–1.0)

## 2011-06-08 LAB — LIPID PANEL
Cholesterol: 197 mg/dL (ref 0–200)
Triglycerides: 142 mg/dL (ref <150)

## 2011-06-08 LAB — PROTIME-INR: INR: 1.01 (ref 0.00–1.49)

## 2011-06-08 LAB — FIBRINOGEN: Fibrinogen: 458 mg/dL (ref 204–475)

## 2011-06-08 NOTE — Progress Notes (Signed)
SUBJECTIVE [name] is a 60 y.o. male patient, african american , right handed - he presented on 06-06-11  With aphasia. MRI showed  MCA-M2 branch occlusion in the left brain.  {he is now awaiting either in- [facility] or SNF with rehabilitation  . Family at bedside- 8 people. Have no questions except how far the discharge planning has gone.  OBJECTIVE Most recent Vital Signs: Temp: 98.6 F (37 C) (11/24 1400) Temp src: Oral (11/24 1400) BP: 152/79 mmHg (11/24 1400) Pulse Rate: 75  (11/24 1400) Respiratory Rate: 20 O2 Saturdation: 99%  CBG (last 3)   Basename 06/05/11 1911  GLUCAP 107*   Intake/Output from previous day: 11/23 0701 - 11/24 0700 In: 100 [I.V.:100] Out: 275 [Urine:275]  IV Fluid Intake:   0.9%NaCl iv  Diet:  Regular  Activity:  Up with assistance   DVT Prophylaxis:   Studies: Results for orders placed during the hospital encounter of 06/05/11 (from the past 24 hour(s))  URINALYSIS, DIPSTICK ONLY     Status: Normal   Collection Time   06/07/11  5:15 PM      Component Value Range   Specific Gravity, Urine 1.025  1.005 - 1.030    pH 6.5  5.0 - 8.0    Glucose, UA NEGATIVE  NEGATIVE (mg/dL)   Hgb urine dipstick NEGATIVE  NEGATIVE    Bilirubin Urine NEGATIVE  NEGATIVE    Ketones, ur NEGATIVE  NEGATIVE (mg/dL)   Protein, ur NEGATIVE  NEGATIVE (mg/dL)   Urobilinogen, UA 0.2  0.0 - 1.0 (mg/dL)   Nitrite NEGATIVE  NEGATIVE    Leukocytes, UA NEGATIVE  NEGATIVE   URIC ACID     Status: Normal   Collection Time   06/07/11  5:19 PM      Component Value Range   Uric Acid, Serum 4.6  4.0 - 7.8 (mg/dL)  GAMMA GT     Status: Normal   Collection Time   06/07/11  5:19 PM      Component Value Range   GGT 42  7 - 51 (U/L)  FIBRINOGEN     Status: Normal   Collection Time   06/07/11  5:19 PM      Component Value Range   Fibrinogen 386  204 - 475 (mg/dL)  LIPID PANEL     Status: Abnormal   Collection Time   06/07/11  5:19 PM      Component Value Range   Cholesterol 190  0 - 200 (mg/dL)   Triglycerides 161  <096 (mg/dL)   HDL 48  >04 (mg/dL)   Total CHOL/HDL Ratio 4.0     VLDL 22  0 - 40 (mg/dL)   LDL Cholesterol 540 (*) 0 - 99 (mg/dL)    Radiology images reviewed.  Left motor strip strike with residual mild  right motor deficits and mild aphasia .    Physical Exam:  A febrile. Vital signs stable. Distal pulses present . Head is nontraumatic without bruit.  Neurologic: Awake alert and residually aphasic. Nonfluent speech with significant paraphasic errors and word finding difficulties.Gaze- Able to follow in smooth pursuit .Tongue and uvula  Are in midline.  Unable to repeat can name only a few simple objects. Eye movements are full range without nystagmus. Blinks to threat bilaterally.  There is right visual neglect and inattention. No facial weakness.  He is unable to tell me date and day of this week,   Motor system exam reveals no upper or lower extremity drift. Symmetric and  equal strength. Finger to nose accurate.  NIH stroke scale is 5     Dias trial participant .   ASSESSMENT [name] is a 60 y.o. male with a left brain MCA branch infarct , M2 now on ASA for  secondary stroke prevention.   Stroke risk factors:  Mainly hypertension, has hypercholesterolemia, too.  Patient has a supportive family ./ Hospital day # 3  TREATMENT/PLAN Will continue aspirin 325 mg orally every day for stroke prevention. Mobilize out of bed for meals , and with PT or other assistance . Physical/ occupational therapy consults and Rehab evaluation were requested. Speech therapy for language. He will need transesophageal echocardiogram and outpatient prolonged cardiac telemetry for paroxysmal atrial fibrillation either outpatient or on Monday here if he stays for REhab .     Melvyn Novas , M.D.   [facility] Pager: [phone] 06/08/2011 4:05 PM

## 2011-06-09 ENCOUNTER — Inpatient Hospital Stay (HOSPITAL_COMMUNITY): Payer: Medicaid Other

## 2011-06-09 MED ORDER — METOPROLOL SUCCINATE 12.5 MG HALF TABLET
12.5000 mg | ORAL_TABLET | Freq: Every day | ORAL | Status: DC
  Administered 2011-06-09 – 2011-06-11 (×3): 12.5 mg via ORAL
  Filled 2011-06-09 (×4): qty 1

## 2011-06-09 MED ORDER — IOHEXOL 350 MG/ML SOLN
80.0000 mL | Freq: Once | INTRAVENOUS | Status: AC | PRN
  Administered 2011-06-09: 80 mL via INTRAVENOUS

## 2011-06-09 NOTE — Consult Note (Signed)
Asked to evaluated patient's angioedema that developed overnight. He says that he was not on any medications prior to coming to hospital, but he was on ASA and Lisinopril. He did get an experimental stroke medication 2 days ago. I will stop Lisinopril for now as angioedema rate is 2 percent in AA and AA are at higher risk. I will let Dr. Vickey Huger address this situation further when she rounds on patients this morning.   Carmell Austria, MD

## 2011-06-09 NOTE — Progress Notes (Signed)
SUBJECTIVE Carl Gilbert is an afro-american right handed  60 y.o. male who developed angioedema over night, swelling in upper more than lower lip and face, has no breathing trouble but feels funny. He has at  baseline aphasia since his stroke ( admission 06-06-11 ) and this makes his  verbal communication more difficult. He still needs one person assist to transfer to bathroom or bedside commode. He is awaiting rehab placement .  If he leaves the hospital for rehab at an outside facility , will need TEE and cardiac monitor there.    Verbal order to discontinue lisinopril at 8 15 am today.   OBJECTIVE Most recent Vital Signs: Temp: 98.1 F (36.7 C) (11/25 1028) Temp src: Oral (11/25 1028) BP: 157/92 mmHg (11/25 1028) Pulse Rate: 79  (11/25 1028) Respiratory Rate: 19 O2 Saturdation: 100%  CBG (last 3)  No results found for this basename: GLUCAP:3 in the last 72 hours Intake/Output from previous day: 11/24 0701 - 11/25 0700 In: 980 [P.O.:980] Out: 1050 [Urine:1050]  IV Fluid Intake:    Diet: General liquids  Activity: Up with assistance  DVT Prophylaxis: lovenox  Studies: Results for orders placed during the hospital encounter of 06/05/11 (from the past 24 hour(s))  CBC     Status: Abnormal   Collection Time   06/08/11  4:40 PM      Component Value Range   WBC 12.5 (*) 4.0 - 10.5 (K/uL)   RBC 5.42  4.22 - 5.81 (MIL/uL)   Hemoglobin 15.4  13.0 - 17.0 (g/dL)   HCT 16.1  09.6 - 04.5 (%)   MCV 83.8  78.0 - 100.0 (fL)   MCH 28.4  26.0 - 34.0 (pg)   MCHC 33.9  30.0 - 36.0 (g/dL)   RDW 40.9  81.1 - 91.4 (%)   Platelets 218  150 - 400 (K/uL)  COMPREHENSIVE METABOLIC PANEL     Status: Abnormal   Collection Time   06/08/11  4:40 PM      Component Value Range   Sodium 133 (*) 135 - 145 (mEq/L)   Potassium 3.8  3.5 - 5.1 (mEq/L)   Chloride 101  96 - 112 (mEq/L)   CO2 21  19 - 32 (mEq/L)   Glucose, Bld 79  70 - 99 (mg/dL)   BUN 15  6 - 23 (mg/dL)   Creatinine, Ser 7.82   0.50 - 1.35 (mg/dL)   Calcium 9.3  8.4 - 95.6 (mg/dL)   Total Protein 8.2  6.0 - 8.3 (g/dL)   Albumin 4.0  3.5 - 5.2 (g/dL)   AST 15  0 - 37 (U/L)   ALT 14  0 - 53 (U/L)   Alkaline Phosphatase 132 (*) 39 - 117 (U/L)   Total Bilirubin 0.3  0.3 - 1.2 (mg/dL)   GFR calc non Af Amer >90  >90 (mL/min)   GFR calc Af Amer >90  >90 (mL/min)  PROTIME-INR     Status: Normal   Collection Time   06/08/11  4:40 PM      Component Value Range   Prothrombin Time 13.5  11.6 - 15.2 (seconds)   INR 1.01  0.00 - 1.49   APTT     Status: Normal   Collection Time   06/08/11  4:40 PM      Component Value Range   aPTT 33  24 - 37 (seconds)  FIBRINOGEN     Status: Normal   Collection Time   06/08/11  4:40 PM  Component Value Range   Fibrinogen 458  204 - 475 (mg/dL)  URIC ACID     Status: Normal   Collection Time   06/08/11  4:40 PM      Component Value Range   Uric Acid, Serum 4.8  4.0 - 7.8 (mg/dL)  GAMMA GT     Status: Normal   Collection Time   06/08/11  4:40 PM      Component Value Range   GGT 44  7 - 51 (U/L)  LIPID PANEL     Status: Abnormal   Collection Time   06/08/11  4:41 PM      Component Value Range   Cholesterol 197  0 - 200 (mg/dL)   Triglycerides 191  <478 (mg/dL)   HDL 46  >29 (mg/dL)   Total CHOL/HDL Ratio 4.3     VLDL 28  0 - 40 (mg/dL)   LDL Cholesterol 562 (*) 0 - 99 (mg/dL)  URINALYSIS, DIPSTICK ONLY     Status: Abnormal   Collection Time   06/08/11  6:50 PM      Component Value Range   Specific Gravity, Urine 1.016  1.005 - 1.030    pH 5.5  5.0 - 8.0    Glucose, UA NEGATIVE  NEGATIVE (mg/dL)   Hgb urine dipstick NEGATIVE  NEGATIVE    Bilirubin Urine NEGATIVE  NEGATIVE    Ketones, ur NEGATIVE  NEGATIVE (mg/dL)   Protein, ur NEGATIVE  NEGATIVE (mg/dL)   Urobilinogen, UA 1.0  0.0 - 1.0 (mg/dL)   Nitrite NEGATIVE  NEGATIVE    Leukocytes, UA SMALL (*) NEGATIVE      No results found.  Labs reviewed, mild hyponatremia, no new imaging study to review. :  Left  motor strip stroke with residual mild right motor deficits and mild aphasia .  Physical Exam:  Afebrile. Vital signs stable. Distal pulses present . Head is nontraumatic without bruit. New facial edema, concerning for angioedema -   Neurologic: Awake alert and residually aphasic.Nonfluent speech with significant paraphasic errors and word finding difficulties.Unable to repeat ,anomia except for a few simple objects.Gaze- Able to follow in smooth pursuit  Blinks to threat bilaterally. There is right neglect and inattention. No facial weakness. Motor system exam reveals no upper extremity drift. He can not control his right  leg and has a significant gait handicap , weakness on the right.  Marland Kitchen  NIH stroke scale is 5    Dias trial participant .    ASSESSMENT [name] is awaiting rehabilitation placement . He has a left MCA 2 branch occusion , causing aphasia and right leg weakness.  Stroke risk factors: addressed in all progress notes  Hospital day # 4  TREATMENT/PLAN Rehab  Stop lisinopril Start metoprolol or bystolic for BP management.  Melvyn Novas, M.D.   [facility]  [facility] Pager: [phone] 06/09/2011 11:15 AM

## 2011-06-10 DIAGNOSIS — I6330 Cerebral infarction due to thrombosis of unspecified cerebral artery: Secondary | ICD-10-CM

## 2011-06-10 NOTE — Progress Notes (Signed)
Physical Therapy Treatment Patient Details Name: Carl Gilbert MRN: [id number] DOB: 02/24/1951 Today's Date: 06/10/2011  PT Assessment/Plan  PT - Assessment/Plan Comments on Treatment Session: Co-session with OT PT Plan: Discharge plan remains appropriate PT Frequency: Min 4X/week Follow Up Recommendations: Inpatient Rehab Equipment Recommended: Defer to next venue PT Goals  Acute Rehab PT Goals PT Goal: Supine/Side to Sit - Progress: Progressing toward goal PT Goal: Ambulate - Progress: Other (comment) (required increased (A)) Additional Goals PT Goal: Additional Goal #1 - Progress: Progressing toward goal  PT Treatment Precautions/Restrictions  Precautions Precautions: Fall Required Braces or Orthoses: No Restrictions Weight Bearing Restrictions: No Mobility (including Balance) Bed Mobility Bed Mobility: Yes Supine to Sit: 5: Supervision Supine to Sit Details (indicate cue type and reason): supervision for safety.  cues for increased attention to R UE/LE Sitting - Scoot to Edge of Bed: 5: Supervision Transfers Sit to Stand: 4: Min assist;From bed;From chair/3-in-1;With armrests;With upper extremity assist Sit to Stand Details (indicate cue type and reason): cues for hand placement & technique;  min assistance only to position & keep right hand in place on armrest but pt able to transition sit>stand without physical assistance to achieve standing.   Stand to Sit: 3: Mod assist;To bed;To chair/3-in-1;With upper extremity assist;With armrests Stand to Sit Details: assistance to position right hand & maintain placement & control descent.   Ambulation/Gait Ambulation/Gait Assistance: 1: +2 Total assist;Other (comment) (pt=60%) Ambulation/Gait Assistance Details (indicate cue type and reason): assistance for balance- pt really leaning to right, lateral wt shifting to facilitate increased step length RLE; pt adducting/scissoring of RLE; ataxic.   Ambulation Distance (Feet): 50  Feet Assistive device: 2 person hand held assist Gait Pattern: Step-through pattern;Decreased weight shift to left;Decreased stance time - right;Scissoring;Ataxic  Dynamic Standing Balance Dynamic Standing - Balance Support: Left upper extremity supported;Right upper extremity supported Dynamic Standing - Level of Assistance: 3: Mod assist Dynamic Standing - Balance Activities: Lateral lean/weight shifting;Forward lean/weight shifting;Reaching for objects;Reaching across midline Exercise    End of Session PT - End of Session Equipment Utilized During Treatment: Gait belt Activity Tolerance: Patient tolerated treatment well Patient left: in chair;with call bell in reach;with family/visitor present General Behavior During Session: [facility] for tasks performed Cognition: Impaired Cognitive Impairment: difficult to assess due to expressive/receptive aphasia.  Difficulty following simple 1-step commands despite max cueing (visual/verbal/tactile).  Pt seems to get frustrated if unable to verbalize/express words/thoughts  Lara Mulch 06/10/2011, 3:44 PM [phone]

## 2011-06-10 NOTE — Progress Notes (Signed)
Stroke Team Progress Note  SUBJECTIVE [name] is a 59 y.o. male who had new right sided weakness over the weekend. He feels his symptoms of new extremity weakness  are unchanged. Friend at the bedside.he had a followup CT scan and CT angiogram which are essentially appeared stable and showed expected evolutionary changes in the left MCA branch infarct without any new definite infarct. 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  OBJECTIVE Most recent Vital Signs: Temp: 98.2 F (36.8 C) (11/26 0941) Temp src: Oral (11/26 0941) BP: 150/91 mmHg (11/26 0941) Pulse Rate: 70  (11/26 0941) Respiratory Rate: 17 O2 Saturdation: 100%  CBG (last 3)  No results found for this basename: GLUCAP:3 in the last 72 hours Intake/Output from previous day:    IV Fluid Intake:    Diet: General thin liquids  Activity: Up with assistance  DVT Prophylaxis:  Lovenox 40 mg sq daily   Studies: No results found for this or any previous visit (from the past 24 hour(s)).   Ct Angio Head & Neck W/cm &/or Wo/cm  06/09/2011  *RADIOLOGY REPORT*  Clinical Data:  New onset right-sided weakness.  Confusion. Question new infarct?  CT ANGIOGRAPHY HEAD AND NECK  Technique:  Multidetector CT imaging of the head and neck was performed using the standard protocol during bolus administration of intravenous contrast.  Multiplanar CT image reconstructions including MIPs were obtained to evaluate the vascular anatomy. Carotid stenosis measurements (when applicable) are obtained utilizing NASCET criteria, using the distal internal carotid diameter as the denominator.  Contrast: 80mL OMNIPAQUE IOHEXOL 350 MG/ML IV SOLN  Comparison:  06/06/2011 CT.  06/05/2011 MR.  CTA NECK  Findings:  Normal configuration of the origin of the great vessels from the aortic arch.  Left  vertebral artery is dominant.  Proximal 3 cm of the left vertebral artery with plaque and mild narrowing.  Congenitally tiny right vertebral artery.  Mild irregularity proximal right internal carotid artery without measurable stenosis.  Right internal carotid artery projects medially impressing upon the posterior aspect of the pharynx.  Streak artifact slightly limits evaluation of the proximal left common carotid artery.  No high-grade stenosis noted.  Mild irregularity proximal left internal carotid artery without measurable significant stenosis.  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  Scattered normal top normal sized lymph nodes.   Review of the MIP images confirms the above findings.  IMPRESSION: Mild irregularity without evidence of hemodynamically significant stenosis involving either carotid bifurcation.  Mild narrowing proximal 3 cm left vertebral artery.  Left vertebral artery is the dominant vertebral artery with a congenitally tiny right vertebral artery.  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  CTA HEAD  Findings:  Left middle cerebral artery distribution infarcts once again noted.  The borders are slightly better defined which may represent expected evolution of previously noted infarct. Difficult to exclude extension of this infarct by CT imaging (requiring MR).  Prominent small vessel disease type changes.  No intracranial hemorrhage.  No intracranial mass lesion.  Enhancement along the left hemispheric infarct may be related to areas of slow flow/luxury perfusion.  Left vertebral artery is dominant and ectatic.  Diminutive size right vertebral artery and right PICA.  Ectatic basilar artery without   high-grade stenosis.  No evidence of hemodynamically significant stenosis involving the cavernous/supraclinoid segment of the internal carotid artery on either side.  Mild irregularity and  narrowing involving portions of the M1 segment of the left middle cerebral artery without high-grade stenosis.  Decreased number of visualized left middle cerebral artery branch vessels consistent with the patient's acute infarct.  Mild irregularity A1 segment left anterior cerebral artery.  No aneurysm noted.   Review of the MIP images confirms the above findings.  IMPRESSION: Left hemispheric infarcts with better defined borders which may represent expected evolution of the left hemispheric infarcts. Extension not excluded by CT, requiring MR.  Mild irregularity narrowing of the M1 segment of the left middle cerebral artery without high-grade stenosis.  Decreased number visualized left middle cerebral artery branch vessels consistent with the patient's acute infarct.  Left vertebral artery is dominant with diminutive size right vertebral artery.  Original Report Authenticated By: STEVEN R. OLSON, M.D.    Physical Exam:   Awake alert sitting up comfortably in bed. Neck is supple without bruit. Cardiac exam no murmur or gallop. Lungs clear to auscultation.  Neurological exam awake alert significant global aphasia the receptive plus expressive. Speaks short sentences and a few words which are non-fluent with paraphasic errors and word hesitancy. Eye movements are full range without nystagmus. Blinks to threat bilaterally. Mild right-sided visual and sensory neglect. Mild right upper and lower extremity drift. Mild right lower facial weakness. Coordination is impaired on the right. Good left-sided strength.  NIH stroke scale 8  ASSESSMENT [name] is a 59 y.o. male with a embolic left MCA infarct, secondary to left M2 occlusion on aspirin 325 mg orally every day for secondary stroke prevention. Neuro worsening with increased R hemiparesis over the weekend. Likely evolving left MCA branch infarct.No new infarct noted on the CT scan.  Stroke risk factors:  hypertension  Hospital day #  5  TREATMENT/PLAN TEE. OP tele monitoring for AFib. Continue ASA. Rehab vs SNF.discussed with patient and friend. SHARON BIBY, AVNP, ANP-BC, GNP-BC [facility] Pager: [phone] 06/10/2011 12:29 PM  Dr. Tristy Udovich, Stroke Center Medical Director, has personally reviewed chart, pertinent data, examined the patient and developed the plan of care.  

## 2011-06-10 NOTE — Progress Notes (Signed)
Occupational Therapy Treatment Patient Details Name: Carl Gilbert MRN: [id number] DOB: 08-19-1950 Today's Date: 06/10/2011  OT Assessment/Plan OT Assessment/Plan OT Plan: Discharge plan remains appropriate OT Frequency: Min 2X/week Follow Up Recommendations: Inpatient Rehab Equipment Recommended: Defer to next venue OT Goals ADL Goals ADL Goal: Grooming - Progress: Progressing toward goals ADL Goal: Upper Body Dressing - Progress: Not addressed ADL Goal: Lower Body Dressing - Progress: Progressing toward goals ADL Goal: Toilet Transfer - Progress: Progressing toward goals ADL Goal: Toileting - Clothing Manipulation - Progress: Not addressed ADL Goal: Toileting - Hygiene - Progress: Not addressed ADL Goal: Tub/Shower Transfer - Progress: Not addressed  OT Treatment Precautions/Restrictions  Precautions Precautions: Fall Required Braces or Orthoses: No Restrictions Weight Bearing Restrictions: No   ADL ADL Grooming: Performed;Wash/dry hands;Wash/dry face;Minimal assistance;Moderate assistance (pt. leaning heavy on sink counter) Grooming Details (indicate cue type and reason): max. inst., tactile, and demonstrational cues throughout session for sequencing and task completion Where Assessed - Grooming: Standing at sink Lower Body Dressing: Performed;Supervision/safety Lower Body Dressing Details (indicate cue type and reason): inst. and demonstrational cues for sequencing and task completion Where Assessed - Lower Body Dressing: Sitting, chair Toilet Transfer: Simulated;+2 Total assistance;Comment for patient % (60%) Toilet Transfer Details (indicate cue type and reason): sim. to chair in room Toilet Transfer Method: Ambulating ADL Comments: refer to P.T. notes for ambulation details, aphasia greatly effecting tasks Mobility  Bed Mobility Bed Mobility: Yes Supine to Sit: 5: Supervision Sitting - Scoot to Edge of Bed: 5: Supervision Transfers Transfers: Yes Sit to  Stand: 3: Mod assist;With upper extremity assist;From bed;From chair/3-in-1 (assistance required for right hand placement during transfer) Sit to Stand Details (indicate cue type and reason): assistance provided on right side, mainly r ue use for safety due to weakness  Stand to Sit: 3: Mod assist;With upper extremity assist;To chair/3-in-1 Stand to Sit Details: con't. assistance with right side and right ue Exercises    End of Session OT - End of Session Equipment Utilized During Treatment: Gait belt Activity Tolerance: Patient tolerated treatment well Patient left: in chair;with call bell in reach;with family/visitor present General Behavior During Session: [facility] for tasks performed Cognition: Impaired Cognitive Impairment: presents with expressive and receptive difficulties/aphasia? demonstrates great difficulty with verbalization or demonstration of understanding of 1 step commands with various cues  Robet Leu COTA/L 06/10/2011, 2:35 PM

## 2011-06-10 NOTE — Consult Note (Signed)
Physical Medicine and Rehabilitation Consult Reason for Consult:stroke Referring Phsyician: DR.Sethi Rohail Carl Gilbert is an 60 y.o. male.   HPI: 60 year old black male admitted November 21 with altered mental status and slurred speech. Cranial CT scan was negative for acute changes. Patient did not receive TPA. 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 CTA  Of head showed a MCA branch occlusion in the left brain. Echocardiogram with ejection fraction of 65% without wall motion abnormalities. Patient placed on aspirin and subcutaneous Lovenox for deep vein thrombosis prophylaxis stroke prophylaxis. Plan remains for a TEE. Currently patient required moderate assistance to ambulate 10 feet. Patient was seen by inpatient rehabilitation services at the recommendations of physical therapy to consider inpatient rehabilitation services.  Review of Systems  Constitutional: Negative for fever and malaise/fatigue.  Eyes: Negative for blurred vision.  Respiratory: Negative for cough and shortness of breath.   Cardiovascular: Negative for chest pain and orthopnea.  Gastrointestinal: Negative for nausea and constipation.  Genitourinary: Negative for frequency.  Musculoskeletal: Negative for myalgias and back pain.  Neurological: Positive for speech change. Negative for dizziness and headaches.  Psychiatric/Behavioral: Negative for depression.   Past Medical History  Diagnosis Date  . Hypertension    History reviewed. No pertinent past surgical history. History reviewed. No pertinent family history. Social History:  reports that he has been smoking.  He does not have any smokeless tobacco history on file. He reports that he drinks alcohol. He reports that he does not use illicit drugs. Allergies: No Known Allergies Medications Prior to Admission  Medication Dose Route Frequency Provider Last Rate Last Dose  . acetaminophen (TYLENOL) tablet 650 mg  650 mg Oral  Q4H PRN Gates Rigg, MD   650 mg at 06/09/11 1007  . aspirin tablet 325 mg  325 mg Oral Daily Gates Rigg, MD   325 mg at 06/09/11 1007  . enoxaparin (LOVENOX) injection 40 mg  40 mg Subcutaneous Q24H Gates Rigg, MD   40 mg at 06/09/11 1100  . iohexol (OMNIPAQUE) 350 MG/ML injection 50 mL  50 mL Intravenous Once PRN Medication Radiologist   50 mL at 06/06/11 0019  . iohexol (OMNIPAQUE) 350 MG/ML injection 80 mL  80 mL Intravenous Once PRN Medication Radiologist   80 mL at 06/09/11 1829  . metoprolol succinate (TOPROL-XL) 24 hr tablet 12.5 mg  12.5 mg Oral Daily Melvyn Novas, MD   12.5 mg at 06/09/11 1515  . ondansetron (ZOFRAN) injection 4 mg  4 mg Intravenous Q6H PRN Pramodkumar Lina Sayre, MD      . senna-docusate (Senokot-S) tablet 1 tablet  1 tablet Oral QHS PRN Gates Rigg, MD      . DISCONTD: 0.9 %  sodium chloride infusion   Intravenous Continuous April K Palumbo-Rasch, MD 100 mL/hr at 06/06/11 1800    . DISCONTD: acetaminophen (TYLENOL) suppository 650 mg  650 mg Rectal Q4H PRN Pramodkumar Lina Sayre, MD      . DISCONTD: acetaminophen (TYLENOL) suppository 650 mg  650 mg Rectal Q4H PRN Pramodkumar Lina Sayre, MD      . DISCONTD: acetaminophen (TYLENOL) tablet 650 mg  650 mg Oral Q4H PRN Pramodkumar Lina Sayre, MD      . DISCONTD: DIAS-4 TRIAL - desmoteplase / placebo study drug  (PI-Sethi)  90 mcg/kg Intravenous Once Pramodkumar Lina Sayre, MD      . DISCONTD: labetalol (NORMODYNE,TRANDATE) injection 10-80 mg  10-80 mg Intravenous Q10 min PRN Pramodkumar Lina Sayre,  MD      . DISCONTD: labetalol (NORMODYNE,TRANDATE) injection 10-80 mg  10-80 mg Intravenous Q10 min PRN Pramodkumar Lina Sayre, MD      . DISCONTD: lisinopril (PRINIVIL,ZESTRIL) tablet 10 mg  10 mg Oral Daily Pramodkumar Lina Sayre, MD   10 mg at 06/08/11 1046  . DISCONTD: ondansetron (ZOFRAN) injection 4 mg  4 mg Intravenous Q6H PRN Pramodkumar Lina Sayre, MD      . DISCONTD: pantoprazole (PROTONIX) injection 40 mg  40  mg Intravenous QHS Gates Rigg, MD   40 mg at 06/06/11 2200  . DISCONTD: pantoprazole (PROTONIX) injection 40 mg  40 mg Intravenous QHS Pramodkumar Lina Sayre, MD      . DISCONTD: senna-docusate (Senokot-S) tablet 1 tablet  1 tablet Oral QHS PRN Pramodkumar Lina Sayre, MD       Medications Prior to Admission  Medication Sig Dispense Refill  . aspirin 325 MG tablet Take 325 mg by mouth as needed. For pain.      Marland Kitchen lisinopril (PRINIVIL,ZESTRIL) 20 MG tablet Take 0.5 tablets (10 mg total) by mouth daily.  31 tablet  0    Home: Home Living Lives With: Spouse Receives Help From: Family  Functional History: Prior Function Comments: unknown due to aphasia; pt inaccurate with yes/no biographical ?'s Functional Status:  Mobility: Bed Mobility Bed Mobility: Yes Supine to Sit: 5: Supervision Supine to Sit Details (indicate cue type and reason): patient not attending to RUE- left twisted at side and required assist to reposition Sitting - Scoot to Edge of Bed: 6: Modified independent (Device/Increase time) Sit to Supine - Right: 6: Modified independent (Device/Increase time);HOB flat Transfers Transfers: Yes Sit to Stand: From bed;From chair/3-in-1;With armrests (Min guard A) Sit to Stand Details (indicate cue type and reason): min-guard A for safety due to RLE weakness Stand to Sit: 4: Min assist;To chair/3-in-1;To bed Stand to Sit Details: as above Ambulation/Gait Ambulation/Gait: Yes Ambulation/Gait Assistance: 3: Mod assist Ambulation/Gait Assistance Details (indicate cue type and reason): poor clearance RLE, visual cues to lift RLE resulted in pt adducting with near scissoring of RLE; decr attention to RLE appears more problematic than true weakness Ambulation Distance (Feet): 10 Feet (seated rest and then ambulated 45 ft) Assistive device: 1 person hand held assist Gait Pattern: Decreased step length - right;Decreased stance time - right;Decreased hip/knee flexion -  right;Scissoring Stairs: No    ADL: ADL Toilet Transfer: Simulated (Min guard A for sit to stand from EOB) Toileting - Clothing Manipulation: Minimal assistance Where Assessed - Toileting Clothing Manipulation:  (sit to stand from EOB) ADL Comments: Mod assist with ambulation- patient dragging Rt foot and scissoring- unable to provide VC to improve secondary to aphasia  Cognition: Cognition Overall Cognitive Status: Impaired Arousal/Alertness: Awake/alert Orientation Level: Oriented to person;Oriented to place Attention: Sustained Sustained Attention: Appears intact Memory:  (unable to assess secondary to language impairment) Awareness: Impaired Awareness Impairment: Intellectual impairment;Emergent impairment;Anticipatory impairment Problem Solving: Impaired Problem Solving Impairment: Verbal basic;Verbal complex Executive Function: Self Monitoring;Self Correcting Self Monitoring: Impaired Self Monitoring Impairment: Verbal basic;Verbal complex Self Correcting: Impaired Self Correcting Impairment: Verbal basic;Verbal complex Behaviors: Perseveration Safety/Judgment: Impaired Comments: 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  Has difficulty understanding hospital restrictions and safety precautions.  Cognition Arousal/Alertness: Awake/alert Overall Cognitive Status: Difficult to assess Difficult to assess due to: impaired communication Orientation Level: Oriented to person;Oriented to place  Blood pressure 124/71, pulse 66, temperature 98.1 F (36.7 C), temperature source Oral, resp. rate 18, height 5\' 10"  (  1.778 m), weight 81.3 kg (179 lb 3.7 oz), SpO2 98.00%. Physical Exam  Constitutional: He appears well-developed.  HENT:  Head: Normocephalic.  Neck: Normal range of motion. Neck supple. No thyromegaly present.  Cardiovascular: Normal rate.   Pulmonary/Chest: Effort normal. He has no wheezes.  Abdominal: He exhibits no distension. There is  no tenderness.  Musculoskeletal: Normal range of motion.  Neurological: A cranial nerve deficit and sensory deficit is present. Coordination abnormal.  Reflex Scores:      Tricep reflexes are 1+ on the right side and 1+ on the left side.      Bicep reflexes are 1+ on the right side and 1+ on the left side.      Brachioradialis reflexes are 1+ on the right side and 1+ on the left side.      Patellar reflexes are 1+ on the right side and 1+ on the left side.      Achilles reflexes are 1+ on the right side and 1+ on the left side.      Patient with receptive aphasia and apraxia. There is an element of expressive language deficit is well. He moves right upper lower tremulous and back labor has difficulty when asked to do so in a specific manner. He is unable to identify simple objects. It stays name with cues.  Patient with a right central 7 and tongue deviation. He also has diminished sensation over the right half of the face and right arm and leg although is able to discern pain. He appears to have a right hemi-inattention is well. There may be a mild right field cut additionally but it's hard to tell given his inattention and language deficits.  Skin: Skin is warm and dry.  Psychiatric:       Limited due to aphasia    No results found for this or any previous visit (from the past 24 hour(s)). Ct Angio Head W/cm &/or Wo Cm  06/09/2011  *RADIOLOGY REPORT*  Clinical Data:  New onset right-sided weakness.  Confusion. Question new infarct?  CT ANGIOGRAPHY HEAD AND NECK  Technique:  Multidetector CT imaging of the head and neck was performed using the standard protocol during bolus administration of intravenous contrast.  Multiplanar CT image reconstructions including MIPs were obtained to evaluate the vascular anatomy. Carotid stenosis measurements (when applicable) are obtained utilizing NASCET criteria, using the distal internal carotid diameter as the denominator.  Contrast: 80mL OMNIPAQUE IOHEXOL  350 MG/ML IV SOLN  Comparison:  06/06/2011 CT.  06/05/2011 MR.  CTA NECK  Findings:  Normal configuration of the origin of the great vessels from the aortic arch.  Left vertebral artery is dominant.  Proximal 3 cm of the left vertebral artery with plaque and mild narrowing.  Congenitally tiny right vertebral artery.  Mild irregularity proximal right internal carotid artery without measurable stenosis.  Right internal carotid artery projects medially impressing upon the posterior aspect of the pharynx.  Streak artifact slightly limits evaluation of the proximal left common carotid artery.  No high-grade stenosis noted.  Mild irregularity proximal left internal carotid artery without measurable significant stenosis.  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  Scattered normal top normal sized lymph nodes.   Review of the MIP images confirms the above findings.  IMPRESSION: Mild irregularity without evidence of hemodynamically significant stenosis involving either carotid bifurcation.  Mild narrowing proximal 3 cm left vertebral artery.  Left vertebral artery is the dominant vertebral artery with a  congenitally tiny right vertebral artery.  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  CTA HEAD  Findings:  Left middle cerebral artery distribution infarcts once again noted.  The borders are slightly better defined which may represent expected evolution of previously noted infarct. Difficult to exclude extension of this infarct by CT imaging (requiring MR).  Prominent small vessel disease type changes.  No intracranial hemorrhage.  No intracranial mass lesion.  Enhancement along the left hemispheric infarct may be related to areas of slow flow/luxury perfusion.  Left vertebral artery is dominant and ectatic.  Diminutive size right vertebral artery and right PICA.  Ectatic basilar artery without high-grade  stenosis.  No evidence of hemodynamically significant stenosis involving the cavernous/supraclinoid segment of the internal carotid artery on either side.  Mild irregularity and narrowing involving portions of the M1 segment of the left middle cerebral artery without high-grade stenosis.  Decreased number of visualized left middle cerebral artery branch vessels consistent with the patient's acute infarct.  Mild irregularity A1 segment left anterior cerebral artery.  No aneurysm noted.   Review of the MIP images confirms the above findings.  IMPRESSION: Left hemispheric infarcts with better defined borders which may represent expected evolution of the left hemispheric infarcts. Extension not excluded by CT, requiring MR.  Mild irregularity narrowing of the M1 segment of the left middle cerebral artery without high-grade stenosis.  Decreased number visualized left middle cerebral artery branch vessels consistent with the patient's acute infarct.  Left vertebral artery is dominant with diminutive size right vertebral artery.  Original Report Authenticated By: Fuller Canada, M.D.   Ct Angio Neck W/cm &/or Wo/cm  06/09/2011  *RADIOLOGY REPORT*  Clinical Data:  New onset right-sided weakness.  Confusion. Question new infarct?  CT ANGIOGRAPHY HEAD AND NECK  Technique:  Multidetector CT imaging of the head and neck was performed using the standard protocol during bolus administration of intravenous contrast.  Multiplanar CT image reconstructions including MIPs were obtained to evaluate the vascular anatomy. Carotid stenosis measurements (when applicable) are obtained utilizing NASCET criteria, using the distal internal carotid diameter as the denominator.  Contrast: 80mL OMNIPAQUE IOHEXOL 350 MG/ML IV SOLN  Comparison:  06/06/2011 CT.  06/05/2011 MR.  CTA NECK  Findings:  Normal configuration of the origin of the great vessels from the aortic arch.  Left vertebral artery is dominant.  Proximal 3 cm of the left  vertebral artery with plaque and mild narrowing.  Congenitally tiny right vertebral artery.  Mild irregularity proximal right internal carotid artery without measurable stenosis.  Right internal carotid artery projects medially impressing upon the posterior aspect of the pharynx.  Streak artifact slightly limits evaluation of the proximal left common carotid artery.  No high-grade stenosis noted.  Mild irregularity proximal left internal carotid artery without measurable significant stenosis.  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  Scattered normal top normal sized lymph nodes.   Review of the MIP images confirms the above findings.  IMPRESSION: Mild irregularity without evidence of hemodynamically significant stenosis involving either carotid bifurcation.  Mild narrowing proximal 3 cm left vertebral artery.  Left vertebral artery is the dominant vertebral artery with a congenitally tiny right vertebral artery.  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  CTA HEAD  Findings:  Left middle cerebral artery distribution infarcts once again noted.  The borders are slightly better defined which may represent  expected evolution of previously noted infarct. Difficult to exclude extension of this infarct by CT imaging (requiring MR).  Prominent small vessel disease type changes.  No intracranial hemorrhage.  No intracranial mass lesion.  Enhancement along the left hemispheric infarct may be related to areas of slow flow/luxury perfusion.  Left vertebral artery is dominant and ectatic.  Diminutive size right vertebral artery and right PICA.  Ectatic basilar artery without high-grade stenosis.  No evidence of hemodynamically significant stenosis involving the cavernous/supraclinoid segment of the internal carotid artery on either side.  Mild irregularity and narrowing involving portions of the M1 segment of the  left middle cerebral artery without high-grade stenosis.  Decreased number of visualized left middle cerebral artery branch vessels consistent with the patient's acute infarct.  Mild irregularity A1 segment left anterior cerebral artery.  No aneurysm noted.   Review of the MIP images confirms the above findings.  IMPRESSION: Left hemispheric infarcts with better defined borders which may represent expected evolution of the left hemispheric infarcts. Extension not excluded by CT, requiring MR.  Mild irregularity narrowing of the M1 segment of the left middle cerebral artery without high-grade stenosis.  Decreased number visualized left middle cerebral artery branch vessels consistent with the patient's acute infarct.  Left vertebral artery is dominant with diminutive size right vertebral artery.  Original Report Authenticated By: Fuller Canada, M.D.    Assessment/Plan: Diagnosis: Left MCA stroke 1. Does the need for close, 24 hr/day medical supervision in concert with the patient's rehab needs make it unreasonable for this patient to be served in a less intensive setting? Yes 2. Co-Morbidities requiring supervision/potential complications: Hypertension 3. Due to bladder management, bowel management, safety, skin/wound care, disease management, medication administration and patient education, does the patient require 24 hr/day rehab nursing? Yes 4. Does the patient require coordinated care of a physician, rehab nurse, PT (1-2 hrs/day, 5 days/week), OT (1-2 hrs/day, 5 days/week) and SLP (1-2 hrs/day, 5 days/week) to address physical and functional deficits in the context of the above medical diagnosis(es)? Yes Addressing deficits in the following areas: balance, bathing, bowel/bladder control, cognition, dressing, endurance, grooming, locomotion, psychosocial adjustment, speech, strength, swallowing, toileting and transferring 5. Can the patient actively participate in an intensive therapy program of at  least 3 hrs of therapy per day at least 5 days per week? Yes 6. The potential for patient to make measurable gains while on inpatient rehab is excellent 7. Anticipated functional outcomes upon discharge from inpatients are supervision PT, supervision to minimal assistance OT, minimal assistance SLP 8. Estimated rehab length of stay to reach the above functional goals is: 10 days 9. Does the patient have adequate social supports to accommodate these discharge functional goals? Yes and Potentially 10. Anticipated D/C setting: Home 11. Anticipated post D/C treatments: HH therapy 12. Overall Rehab/Functional Prognosis: good  RECOMMENDATIONS: This patient's condition is appropriate for continued rehabilitative care in the following setting: CIR Patient has agreed to participate in recommended program. Yes and Potentially Note that insurance prior authorization may be required for reimbursement for recommended care.  Comment: Need to confirm social supports. This patient will need 24-hour assistance upon discharge to home.   ANGIULLI,DANIEL J. 06/10/2011

## 2011-06-10 NOTE — Progress Notes (Signed)
Speech Language/Pathology Aphasia Treatment Note  Pt seen for aphasia treatment.  Friend present.  Reviewed nature of aphasia and impact on communication.  Pt named functional objects to confrontation with max assist initially, given semantic cues and model.  Able to fade cues so that pt able to alternate between two target words with written word cue by end of session.  Max assist (visual/tactile cues) required to follow one-step commands.  Responded to social questions given predictable context with 1-2 word phrases.  Speech agrammatic with phonemic paraphasias.  Showing improved recognition of expressive errors (shaking head "no"), but no attempts to self-correct.  Making progress toward goals.  No reports of pain.  Jaedin Regina L. Samson Frederic, [place] CCC/SLP Pager [phone]

## 2011-06-11 ENCOUNTER — Encounter (HOSPITAL_COMMUNITY)
Admission: EM | Disposition: A | Discharge status: Rehabilitation Facility | Payer: Self-pay | Source: Ambulatory Visit | Attending: Neurology

## 2011-06-11 ENCOUNTER — Encounter (HOSPITAL_COMMUNITY): Payer: Self-pay | Admitting: *Deleted

## 2011-06-11 ENCOUNTER — Inpatient Hospital Stay (HOSPITAL_COMMUNITY)
Admission: RE | Admit: 2011-06-11 | Discharge: 2011-06-21 | DRG: 945 | Disposition: A | Discharge status: Rehabilitation Facility | Payer: Medicaid Other | Source: Ambulatory Visit | Attending: Physical Medicine & Rehabilitation | Admitting: Physical Medicine & Rehabilitation

## 2011-06-11 DIAGNOSIS — I6789 Other cerebrovascular disease: Secondary | ICD-10-CM

## 2011-06-11 DIAGNOSIS — I10 Essential (primary) hypertension: Secondary | ICD-10-CM | POA: Diagnosis present

## 2011-06-11 DIAGNOSIS — G8190 Hemiplegia, unspecified affecting unspecified side: Secondary | ICD-10-CM | POA: Diagnosis present

## 2011-06-11 DIAGNOSIS — F1010 Alcohol abuse, uncomplicated: Secondary | ICD-10-CM | POA: Diagnosis present

## 2011-06-11 DIAGNOSIS — I69320 Aphasia following cerebral infarction: Secondary | ICD-10-CM

## 2011-06-11 DIAGNOSIS — IMO0002 Reserved for concepts with insufficient information to code with codable children: Secondary | ICD-10-CM

## 2011-06-11 DIAGNOSIS — I6350 Cerebral infarction due to unspecified occlusion or stenosis of unspecified cerebral artery: Secondary | ICD-10-CM | POA: Diagnosis present

## 2011-06-11 DIAGNOSIS — R4701 Aphasia: Secondary | ICD-10-CM | POA: Diagnosis present

## 2011-06-11 DIAGNOSIS — F1210 Cannabis abuse, uncomplicated: Secondary | ICD-10-CM | POA: Diagnosis present

## 2011-06-11 DIAGNOSIS — I6330 Cerebral infarction due to thrombosis of unspecified cerebral artery: Secondary | ICD-10-CM

## 2011-06-11 DIAGNOSIS — Z5189 Encounter for other specified aftercare: Principal | ICD-10-CM

## 2011-06-11 DIAGNOSIS — F17200 Nicotine dependence, unspecified, uncomplicated: Secondary | ICD-10-CM | POA: Diagnosis present

## 2011-06-11 DIAGNOSIS — Z8673 Personal history of transient ischemic attack (TIA), and cerebral infarction without residual deficits: Secondary | ICD-10-CM

## 2011-06-11 DIAGNOSIS — I639 Cerebral infarction, unspecified: Secondary | ICD-10-CM

## 2011-06-11 HISTORY — PX: TEE WITHOUT CARDIOVERSION: SHX5443

## 2011-06-11 HISTORY — DX: Cerebral infarction, unspecified: I63.9

## 2011-06-11 SURGERY — ECHOCARDIOGRAM, TRANSESOPHAGEAL
Anesthesia: Moderate Sedation

## 2011-06-11 MED ORDER — POLYETHYLENE GLYCOL 3350 17 G PO PACK
17.0000 g | PACK | Freq: Every day | ORAL | Status: DC | PRN
  Filled 2011-06-11: qty 1

## 2011-06-11 MED ORDER — BISACODYL 10 MG RE SUPP: 10.0000 mg | Freq: Every day | RECTAL | Status: DC | PRN

## 2011-06-11 MED ORDER — PROMETHAZINE HCL 12.5 MG RE SUPP: 12.5000 mg | Freq: Four times a day (QID) | RECTAL | Status: DC | PRN

## 2011-06-11 MED ORDER — MIDAZOLAM HCL 10 MG/2ML IJ SOLN
INTRAMUSCULAR | Status: AC
  Filled 2011-06-11: qty 2

## 2011-06-11 MED ORDER — FENTANYL CITRATE 0.05 MG/ML IJ SOLN
INTRAMUSCULAR | Status: AC
  Filled 2011-06-11: qty 2

## 2011-06-11 MED ORDER — BUTAMBEN-TETRACAINE-BENZOCAINE 2-2-14 % EX AERO
INHALATION_SPRAY | CUTANEOUS | Status: DC | PRN
  Administered 2011-06-11 (×2): 1 via TOPICAL

## 2011-06-11 MED ORDER — GUAIFENESIN-DM 100-10 MG/5ML PO SYRP: 5.0000 mL | ORAL_SOLUTION | Freq: Four times a day (QID) | ORAL | Status: DC | PRN

## 2011-06-11 MED ORDER — SORBITOL 70 % SOLN
30.0000 mL | Freq: Two times a day (BID) | Status: DC | PRN
  Administered 2011-06-16: 30 mL via ORAL
  Filled 2011-06-11: qty 30

## 2011-06-11 MED ORDER — FENTANYL CITRATE 0.05 MG/ML IJ SOLN
INTRAMUSCULAR | Status: DC | PRN
  Administered 2011-06-11 (×2): 25 ug via INTRAVENOUS

## 2011-06-11 MED ORDER — PROMETHAZINE HCL 12.5 MG PO TABS: 12.5000 mg | ORAL_TABLET | Freq: Four times a day (QID) | ORAL | Status: DC | PRN

## 2011-06-11 MED ORDER — ASPIRIN 325 MG PO TABS
325.0000 mg | ORAL_TABLET | Freq: Every day | ORAL | Status: DC
  Administered 2011-06-12 – 2011-06-21 (×10): 325 mg via ORAL
  Filled 2011-06-11 (×11): qty 1

## 2011-06-11 MED ORDER — MIDAZOLAM HCL 10 MG/2ML IJ SOLN
INTRAMUSCULAR | Status: DC | PRN
  Administered 2011-06-11: 2 mg via INTRAVENOUS
  Administered 2011-06-11: 1 mg via INTRAVENOUS
  Administered 2011-06-11: 2 mg via INTRAVENOUS

## 2011-06-11 MED ORDER — PROMETHAZINE HCL 25 MG/ML IJ SOLN: 12.5000 mg | Freq: Four times a day (QID) | INTRAMUSCULAR | Status: DC | PRN

## 2011-06-11 MED ORDER — ENOXAPARIN SODIUM 40 MG/0.4ML ~~LOC~~ SOLN
40.0000 mg | SUBCUTANEOUS | Status: DC
  Administered 2011-06-12 – 2011-06-20 (×9): 40 mg via SUBCUTANEOUS
  Filled 2011-06-11 (×11): qty 0.4

## 2011-06-11 MED ORDER — SODIUM CHLORIDE 0.9 % IV SOLN
INTRAVENOUS | Status: DC
  Administered 2011-06-11: 500 mL via INTRAVENOUS

## 2011-06-11 MED ORDER — ALUM & MAG HYDROXIDE-SIMETH 400-400-40 MG/5ML PO SUSP
30.0000 mL | ORAL | Status: DC | PRN
  Filled 2011-06-11: qty 30

## 2011-06-11 MED ORDER — METOPROLOL SUCCINATE 12.5 MG HALF TABLET
12.5000 mg | ORAL_TABLET | Freq: Every day | ORAL | Status: DC
  Administered 2011-06-12: 12.5 mg via ORAL
  Filled 2011-06-11 (×3): qty 1

## 2011-06-11 MED ORDER — SENNOSIDES-DOCUSATE SODIUM 8.6-50 MG PO TABS
2.0000 | ORAL_TABLET | Freq: Every day | ORAL | Status: DC
  Administered 2011-06-11 – 2011-06-19 (×6): 2 via ORAL
  Filled 2011-06-11 (×10): qty 2

## 2011-06-11 NOTE — Progress Notes (Signed)
Patient was transferred today with assessments remaining unchanged,  Vitals stable prior to transfer.

## 2011-06-11 NOTE — Interval H&P Note (Signed)
History and Physical Interval Note:   06/11/2011   11:38 AM   Carl Gilbert  has presented today for surgery, with the diagnosis of Stroke  The various methods of treatment have been discussed with the patient and family. After consideration of risks, benefits and other options for treatment, the patient has consented to  Procedure(s): TRANSESOPHAGEAL ECHOCARDIOGRAM (TEE) as a surgical intervention .  The patients' history has been reviewed, patient examined, no change in status, stable for surgery.  I have reviewed the patients' chart and labs.  Questions were answered to the patient's satisfaction.     Olga Millers  MD H and P reviewed; no new changes Olga Millers
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
## 2011-06-11 NOTE — H&P (View-Only) (Signed)
Stroke Team Progress Note  SUBJECTIVE Carl Gilbert is a 60 y.o. male who had new right sided weakness over the weekend. He feels his symptoms of new extremity weakness  are unchanged. Friend at the bedside.he had a followup CT scan and CT angiogram which are essentially appeared stable and showed expected evolutionary changes in the left MCA branch infarct without any new definite infarct. 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  OBJECTIVE Most recent Vital Signs: Temp: 98.2 F (36.8 C) (11/26 0941) Temp src: Oral (11/26 0941) BP: 150/91 mmHg (11/26 0941) Pulse Rate: 70  (11/26 0941) Respiratory Rate: 17 O2 Saturdation: 100%  CBG (last 3)  No results found for this basename: GLUCAP:3 in the last 72 hours Intake/Output from previous day:    IV Fluid Intake:    Diet: General thin liquids  Activity: Up with assistance  DVT Prophylaxis:  Lovenox 40 mg sq daily   Studies: No results found for this or any previous visit (from the past 24 hour(s)).   Ct Angio Head & Neck W/cm &/or Wo/cm  06/09/2011  *RADIOLOGY REPORT*  Clinical Data:  New onset right-sided weakness.  Confusion. Question new infarct?  CT ANGIOGRAPHY HEAD AND NECK  Technique:  Multidetector CT imaging of the head and neck was performed using the standard protocol during bolus administration of intravenous contrast.  Multiplanar CT image reconstructions including MIPs were obtained to evaluate the vascular anatomy. Carotid stenosis measurements (when applicable) are obtained utilizing NASCET criteria, using the distal internal carotid diameter as the denominator.  Contrast: 80mL OMNIPAQUE IOHEXOL 350 MG/ML IV SOLN  Comparison:  06/06/2011 CT.  06/05/2011 MR.  CTA NECK  Findings:  Normal configuration of the origin of the great vessels from the aortic arch.  Left  vertebral artery is dominant.  Proximal 3 cm of the left vertebral artery with plaque and mild narrowing.  Congenitally tiny right vertebral artery.  Mild irregularity proximal right internal carotid artery without measurable stenosis.  Right internal carotid artery projects medially impressing upon the posterior aspect of the pharynx.  Streak artifact slightly limits evaluation of the proximal left common carotid artery.  No high-grade stenosis noted.  Mild irregularity proximal left internal carotid artery without measurable significant stenosis.  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  Scattered normal top normal sized lymph nodes.   Review of the MIP images confirms the above findings.  IMPRESSION: Mild irregularity without evidence of hemodynamically significant stenosis involving either carotid bifurcation.  Mild narrowing proximal 3 cm left vertebral artery.  Left vertebral artery is the dominant vertebral artery with a congenitally tiny right vertebral artery.  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  CTA HEAD  Findings:  Left middle cerebral artery distribution infarcts once again noted.  The borders are slightly better defined which may represent expected evolution of previously noted infarct. Difficult to exclude extension of this infarct by CT imaging (requiring MR).  Prominent small vessel disease type changes.  No intracranial hemorrhage.  No intracranial mass lesion.  Enhancement along the left hemispheric infarct may be related to areas of slow flow/luxury perfusion.  Left vertebral artery is dominant and ectatic.  Diminutive size right vertebral artery and right PICA.  Ectatic basilar artery without  high-grade stenosis.  No evidence of hemodynamically significant stenosis involving the cavernous/supraclinoid segment of the internal carotid artery on either side.  Mild irregularity and  narrowing involving portions of the M1 segment of the left middle cerebral artery without high-grade stenosis.  Decreased number of visualized left middle cerebral artery branch vessels consistent with the patient's acute infarct.  Mild irregularity A1 segment left anterior cerebral artery.  No aneurysm noted.   Review of the MIP images confirms the above findings.  IMPRESSION: Left hemispheric infarcts with better defined borders which may represent expected evolution of the left hemispheric infarcts. Extension not excluded by CT, requiring MR.  Mild irregularity narrowing of the M1 segment of the left middle cerebral artery without high-grade stenosis.  Decreased number visualized left middle cerebral artery branch vessels consistent with the patient's acute infarct.  Left vertebral artery is dominant with diminutive size right vertebral artery.  Original Report Authenticated By: Fuller Canada, M.D.    Physical Exam:   Awake alert sitting up comfortably in bed. Neck is supple without bruit. Cardiac exam no murmur or gallop. Lungs clear to auscultation.  Neurological exam awake alert significant global aphasia the receptive plus expressive. Speaks short sentences and a few words which are non-fluent with paraphasic errors and word hesitancy. Eye movements are full range without nystagmus. Blinks to threat bilaterally. Mild right-sided visual and sensory neglect. Mild right upper and lower extremity drift. Mild right lower facial weakness. Coordination is impaired on the right. Good left-sided strength.  NIH stroke scale 8  ASSESSMENT Carl Gilbert is a 60 y.o. male with a embolic left MCA infarct, secondary to left M2 occlusion on aspirin 325 mg orally every day for secondary stroke prevention. Neuro worsening with increased R hemiparesis over the weekend. Likely evolving left MCA branch infarct.No new infarct noted on the CT scan.  Stroke risk factors:  hypertension  Hospital day #  5  TREATMENT/PLAN TEE. OP tele monitoring for AFib. Continue ASA. Rehab vs SNF.discussed with patient and friend. Joaquin Music, ANP-BC, GNP-BC [facility] Pager: [phone] 06/10/2011 12:29 PM  Dr. Delia Heady, Stroke Center Medical Director, has personally reviewed chart, pertinent data, examined the patient and developed the plan of care.

## 2011-06-11 NOTE — Progress Notes (Signed)
Evaluated for possible admission.  Patient scheduled for TEE this am.  I spoke with patient and his fiance.  Girlfriend/finace works 7 am to 3 pm.  She will get family and friends to stay with patient while she works.  Patient is applying for disability and medicaid tomorrow per girlfriend.  I will consider for inpatient rehab once TEE completed today.  Call for questions.  Pager [phone]
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
## 2011-06-11 NOTE — Progress Notes (Signed)
Utilization review completed. Ethne Jeon, RN, BSN.06/11/11  

## 2011-06-11 NOTE — Progress Notes (Signed)
Pt will be admitted to CIR today. This CSW is signing off, as pt is not requiring SNF at this time. Please reconsult CSW if a need arises prior to discharge.   Dede Query, MSW, Theresia Majors [phone]

## 2011-06-11 NOTE — Progress Notes (Addendum)
Overall Plan of Care [facility]) Patient Details Name: Carl Gilbert MRN: [id number] DOB: [birth year]  Diagnosis:  Left MCA stroke  Primary Diagnosis:    <principal problem not specified> Co-morbidities: hypertension, stroke  Functional Problem List  Patient demonstrates impairments in the following areas: Balance, Bladder, Bowel, Cognition, Endurance, Linguistic, Medication Management, Motor, Pain, Perception, Safety, Sensory , Skin Integrity and Vision  Basic ADL's: eating, grooming, bathing, dressing and toileting Advanced ADL's: simple meal preparation  Transfers:  bed mobility, bed to chair, toilet, tub/shower, car, furniture and floor Locomotion:  ambulation, wheelchair mobility and stairs  Additional Impairments:  Functional use of upper extremity and Communication  comprehension and expression  Anticipated Outcomes Item Anticipated Outcome  Eating/Swallowing  supervision  Basic self-care  supervision  Tolieting  supervision  Bowel/Bladder  Patient will be min assist.  Transfers  supervision  Locomotion  supervision  Communication  Min A  Cognition  Min A  Pain  Patient will have pain <2.  Safety/Judgment  Patient will remain free from injury this admission.  Other  Patient will have no skin breakdown during this admisssion.   Therapy Plan:  SLP: 5x/wk X 2 weeks       Team Interventions: Item RN PT OT SLP SW TR Other  Self Care/Advanced ADL Retraining   X      Neuromuscular Re-Education  x X      Therapeutic Activities  x X x  x   UE/LE Strength Training/ROM  x X   x   UE/LE Coordination Activities  x X   x   Visual/Perceptual Remediation/Compensation   X   x   DME/Adaptive Equipment Instruction  x X      Therapeutic Exercise  x X   x   Balance/Vestibular Training  x X   x   Patient/Family Education x x X x  x   Cognitive Remediation/Compensation  x X x  x   Functional Mobility Training  x X   x   Ambulation/Gait Training  x       Stair Training   x       Wheelchair Propulsion/Positioning  x       Functional Tourist information centre manager Reintegration   X   x   Dysphagia/Aspiration Film/video editor   X x     Bladder Management x        Bowel Management x        Disease Management/Prevention x        Pain Management x x       Medication Management x        Skin Care/Wound Management x        Splinting/Orthotics         Discharge Planning     x    Psychosocial Support     x                       Team Discharge Planning: Destination:  Home Projected Follow-up:  PT, OT and Outpatient-SLP Projected Equipment Needs:  to be determined Patient/family involved in discharge planning:  Yes  MD ELOS: 7-10 days Medical Rehab Prognosis:  Excellent Assessment: Patient is admitted for inpatient rehabilitation. Physical therapy will work with the patient on lower tremulous strength balance and neuromuscular reeducation. We also will address adaptive equipment as needed. Goals for physical therapy are supervision. OT will be  working with the patient on upper extremity strength neuromuscular reeducation safety adaptive equipment training ADLs with goals at a supervision level. Speech-language pathology will be working with the patient on communication and cognition with goals at the min assist level. Patient is already showing some signs of language improvement and is quite motivated. Therapies will work with the patient at least one to 2 hours per day at least 5 days per week per discipline.

## 2011-06-11 NOTE — Progress Notes (Signed)
Stroke Team Progress Note  SUBJECTIVE Mr. Absalom Aro is a 60 y.o. male who had new right sided weakness over the weekend. He feels his symptoms of new extremity weakness are unchanged. He is headed to TEE for further evaluation of cause of stroke.  OBJECTIVE Most recent Vital Signs: Temp: 97.8 F (36.6 C) (11/27 1053) Temp src: Oral (11/27 1028) BP: 132/84 mmHg (11/27 1230) Pulse Rate: 67  (11/27 1230) Respiratory Rate: 24 O2 Saturdation: 98%  CBG (last 3)  No results found for this basename: GLUCAP:3 in the last 72 hours Intake/Output from previous day: 11/26 0701 - 11/27 0700 In: 500 [P.O.:500] Out: -   IV Fluid Intake:      . sodium chloride 500 mL (06/11/11 1112)   Diet: NPO   Activity: Up with assistance  DVT Prophylaxis:  Lovenox 30 mg sq daily   Studies: No results found for this or any previous visit (from the past 24 hour(s)).   Physical Exam: Awake alert sitting up comfortably in bed. Neck is supple without bruit. Cardiac exam no murmur or gallop. Lungs clear to auscultation.  Neurological exam awake alert significant global aphasia the receptive plus expressive. Speaks short sentences and a few words which are non-fluent with paraphasic errors and word hesitancy. Eye movements are full range without nystagmus. Blinks to threat bilaterally. Mild right-sided visual and sensory neglect. Mild right upper and lower extremity drift. Mild right lower facial weakness. Coordination is impaired on the right. Good left-sided strength.  ASSESSMENT Mr. Seeley Hissong is a 60 y.o. male with an embolic L MCA infarct, secondary to unknown embolic source, on aspirin 325 mg orally every day for secondary stroke prevention. Neuro worsening from weekend now stable.  Stroke risk factors:  hypertension, smoking and etoh use  Hospital day # 6  TREATMENT/PLAN TEE today. Will need OP tele monitoring for Afib after rehab. Rehab hopefully today after TEE.  TEE - results are  negative. Ok for discharge to rehab when bed available. Resume diet.  Joaquin Music, ANP-BC, GNP-BC [facility] Pager: [phone] 06/11/2011 12:49 PM  Dr. Delia Heady, Stroke Center Medical Director, has personally reviewed chart, pertinent data, examined the patient and developed the plan of care.

## 2011-06-11 NOTE — Progress Notes (Signed)
Physical Therapy Treatment Patient Details Name: Adar Rase MRN: [id number] DOB: 01/17/1951 Today's Date: 06/11/2011  PT Assessment/Plan  PT - Assessment/Plan Comments on Treatment Session: Pt progressing with PT goals; improvement with following 1-step commands (~70% of time); Used RW for ambulation today & had increased steadiness however still required max cueing for technique & (A) for balance & management of RW.   PT Plan: Discharge plan remains appropriate PT Frequency: Min 4X/week Follow Up Recommendations: Inpatient Rehab Equipment Recommended: Defer to next venue PT Goals  Acute Rehab PT Goals PT Goal: Ambulate - Progress: Progressing toward goal  PT Treatment Precautions/Restrictions  Precautions Precautions: Fall Required Braces or Orthoses: No Restrictions Weight Bearing Restrictions: No Mobility (including Balance) Bed Mobility Bed Mobility: No (sitting EOB upon arrival & in recliner at end of PT session) Transfers Sit to Stand: 4: Min assist (min (A) only to position RUE) Sit to Stand Details (indicate cue type and reason): Pt can perform sit>stand with min guard but does not use RUE, however pt does require min assist to locate armrest of recliner & maintain hand placement on armrest with RUE to push up to standing; max cues for technique.   Stand to Sit Details: (A) to locate & maintain placement of RUE on armrest; max cues for technique.   Ambulation/Gait Ambulation/Gait Assistance: 1: +2 Total assist;Other (comment) (pt=70%) Ambulation/Gait Assistance Details (indicate cue type and reason): Pt used RW today; increased stability however still very uncoordinated steps; RLE adducting/nearly scissoring.  max cues for sequencing, safe use of RW, advancement of RLE; ocassional tactile cues to advance RLE & increase floor clearance. (A) to maintain placement of right hand on RW ~50% of time  Ambulation Distance (Feet): 100 Feet Assistive device: Rolling walker Gait  Pattern: Decreased step length - right;Decreased stride length;Decreased trunk rotation;Ataxic (decreased floor clearance RLE)    Exercise    End of Session PT - End of Session Equipment Utilized During Treatment: Gait belt Activity Tolerance: Patient tolerated treatment well Patient left: in chair;with call bell in reach;with family/visitor present General Behavior During Session: [facility] for tasks performed Cognition: Impaired Cognitive Impairment: Improvement with following simple 1-step commands although still not consistently (70% of time); Becomes frustrated due to expressive difficulties  Lara Mulch 06/11/2011, 11:57 AM [phone]

## 2011-06-11 NOTE — Brief Op Note (Signed)
See echo report No SOE identified. Olga Millers
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
## 2011-06-12 ENCOUNTER — Encounter (HOSPITAL_COMMUNITY): Payer: Self-pay | Admitting: Cardiology

## 2011-06-12 DIAGNOSIS — Z8673 Personal history of transient ischemic attack (TIA), and cerebral infarction without residual deficits: Secondary | ICD-10-CM

## 2011-06-12 DIAGNOSIS — I639 Cerebral infarction, unspecified: Secondary | ICD-10-CM

## 2011-06-12 DIAGNOSIS — Z5189 Encounter for other specified aftercare: Secondary | ICD-10-CM

## 2011-06-12 DIAGNOSIS — I6330 Cerebral infarction due to thrombosis of unspecified cerebral artery: Secondary | ICD-10-CM

## 2011-06-12 LAB — DIFFERENTIAL
Basophils Absolute: 0.1 10*3/uL (ref 0.0–0.1)
Eosinophils Absolute: 0.5 10*3/uL (ref 0.0–0.7)
Eosinophils Relative: 6 % — ABNORMAL HIGH (ref 0–5)
Lymphocytes Relative: 37 % (ref 12–46)
Lymphs Abs: 3.1 10*3/uL (ref 0.7–4.0)
Monocytes Absolute: 0.9 10*3/uL (ref 0.1–1.0)

## 2011-06-12 LAB — LIPID PANEL
HDL: 42 mg/dL (ref >39)
LDL Cholesterol: 127 mg/dL — ABNORMAL HIGH (ref 0–99)
Total CHOL/HDL Ratio: 4.4 RATIO
Triglycerides: 68 mg/dL (ref <150)
VLDL: 14 mg/dL (ref 0–40)

## 2011-06-12 LAB — CBC
HCT: 45 % (ref 39.0–52.0)
Hemoglobin: 14.8 g/dL (ref 13.0–17.0)
MCH: 28.2 pg (ref 26.0–34.0)
MCHC: 34.7 g/dL (ref 30.0–36.0)
MCV: 84.7 fL (ref 78.0–100.0)
Platelets: 175 10*3/uL (ref 150–400)
Platelets: 201 10*3/uL (ref 150–400)
RBC: 5.25 MIL/uL (ref 4.22–5.81)
RDW: 14 % (ref 11.5–15.5)
WBC: 8.5 10*3/uL (ref 4.0–10.5)
WBC: 10.4 10*3/uL (ref 4.0–10.5)

## 2011-06-12 LAB — COMPREHENSIVE METABOLIC PANEL
ALT: 14 U/L (ref 0–53)
AST: 15 U/L (ref 0–37)
Alkaline Phosphatase: 111 U/L (ref 39–117)
CO2: 24 mEq/L (ref 19–32)
Calcium: 9.2 mg/dL (ref 8.4–10.5)
GFR calc Af Amer: >90 mL/min (ref >90)
GFR calc non Af Amer: 89 mL/min — ABNORMAL LOW (ref >90)
Glucose, Bld: 88 mg/dL (ref 70–99)
Potassium: 4 mEq/L (ref 3.5–5.1)
Sodium: 139 mEq/L (ref 135–145)

## 2011-06-12 LAB — URIC ACID: Uric Acid, Serum: 5.4 mg/dL (ref 4.0–7.8)

## 2011-06-12 NOTE — Progress Notes (Signed)
Physical Therapy Assessment and Plan  Patient Details  Name: Carl Gilbert MRN: [id number] Date of Birth: February 13, 1951  PT Diagnosis: Abnormality of gait, Hemiplegia dominant, Impaired cognition, Impaired sensation and Muscle weakness Rehab Potential: Good ELOS: 10 days to 2 weeks   Assessment & Plan Clinical Impression: Patient is a 60 y.o. year old male with recent admission to the hospital on 06/05/11 with AMS and slurred speech.  Pt dx with L MCA CVA.  Patient transferred to CIR on 06/11/2011 .  Patient's past medical history is significant for htn, smoking, marijuana use.    Patient currently requires mod with mobility secondary to muscle weakness, decreased cardiorespiratoy endurance, impaired timing and sequencing, decreased coordination and decreased motor planning and decreased attention, decreased awareness, decreased problem solving and decreased safety awareness.  Prior to hospitalization, patient was independent with mobility and lived with Spouse in a House home.  Home access is 4Stairs to enter.  Patient will benefit from skilled PT intervention to maximize safe functional mobility, minimize fall risk and decrease caregiver burden for planned discharge home with 24 hour supervision.  Anticipate patient will benefit from follow up OP at discharge.  PT Assessment Rehab Potential: Good PT Plan PT Frequency: 1-2 X/day, 60-90 minutes Estimated Length of Stay: 10 days to 2 weeks PT Treatment/Interventions: Ambulation/gait training;Balance/vestibular training;Cognitive remediation/compensation;DME/adaptive equipment instruction;Community reintegration;Patient/family education;Pain management;Neuromuscular re-education;Functional mobility training;Therapeutic Exercise;UE/LE Strength taining/ROM;UE/LE Coordination activities;Therapeutic Activities;Stair training;Wheelchair propulsion/positioning  Precautions/Restrictions Precautions Precautions: Fall Precaution Comments: bed alarm,  quick release belt Restrictions Weight Bearing Restrictions: No Pain Pain Assessment Pain Assessment: No/denies pain Pain Score: 0-No pain Home Living/Prior Functioning Home Living Lives With: Spouse Receives Help From: Family Type of Home: House Home Layout: One level Home Access: Stairs to enter [employer] of Steps: 4 Prior Function Level of Independence: Independent with basic ADLs;Independent with gait;Independent with transfers Able to Take Stairs?: Yes Driving: Yes    Cognition Overall Cognitive Status: Impaired Arousal/Alertness: Awake/alert Orientation Level: Oriented X4 Attention: Sustained Sustained Attention: Appears intact Awareness: Impaired Awareness Impairment: Intellectual impairment;Emergent impairment;Anticipatory impairment Self Correcting: Impaired Safety/Judgment: Impaired Comments: decreased insight and awareness, needs cues for selective attention Sensation Sensation Light Touch: Impaired by gross assessment Proprioception: Impaired by gross assessment Additional Comments: R LE impaired sensation and proprioception Coordination Gross Motor Movements are Fluid and Coordinated: No Coordination and Movement Description: R LE decreased coordination Motor  Motor Motor - Skilled Clinical Observations: Decreased coordination of movements with R UE and LE  Mobility Bed Mobility Supine to Sit: 5: Supervision Transfers Transfers: Yes Stand Pivot Transfers: 4: Min assist Stand Pivot Transfer Details: Tactile cues for weight shifting;Manual facilitation for weight shifting;Verbal cues for precautions/safety;Verbal cues for technique;Verbal cues for sequencing;Tactile cues for sequencing Locomotion  Ambulation Ambulation/Gait Assistance: 3: Mod assist Ambulation Distance (Feet): 75 Feet Assistive device: None Ambulation/Gait Assistance Details (indicate cue type and reason): Slight foot drag R LE, slight buckling R LE- pt able to self  correct, trendelenber gait on R High Level Ambulation High Level Ambulation:  (gait with obstacle negotiation requires mod A to correct LOB) Stairs / Additional Locomotion Stairs: Yes Stairs Assistance: 3: Mod assist Stairs Assistance Details: Manual facilitation for placement;Verbal cues for precautions/safety;Verbal cues for gait pattern;Verbal cues for technique;Verbal cues for sequencing;Tactile cues for weight shifting;Manual facilitation for weight shifting Stairs Assistance Details (indicate cue type and reason): cues to advance R UE along rail Stair Management Technique: Two rails Number of Stairs: 10  Wheelchair Mobility Wheelchair Mobility: Yes Wheelchair Assistance: 5: Supervision Wheelchair Assistance Details: Verbal  cues for precautions/safety (cues for attention to R) Wheelchair Propulsion: Both lower extermities  Trunk/Postural Assessment  Cervical Assessment Cervical Assessment: Within Functional Limits Thoracic Assessment Thoracic Assessment: Within Functional Limits Lumbar Assessment Lumbar Assessment: Within Functional Limits  Balance Static Sitting Balance Static Sitting - Balance Support: Feet unsupported Static Sitting - Level of Assistance: 5: Stand by assistance Static Standing Balance Static Standing - Balance Support: No upper extremity supported Static Standing - Level of Assistance: 4: Min assist Dynamic Standing Balance Dynamic Standing - Balance Support: No upper extremity supported Dynamic Standing - Level of Assistance: 3: Mod assist Dynamic Standing - Balance Activities: Lateral lean/weight shifting;Forward lean/weight shifting;Reaching for objects Extremity Assessment  RLE Assessment RLE Assessment: Exceptions to [place] RLE Strength RLE Overall Strength Comments: R LE grossly 3/5 strength LLE Assessment LLE Assessment: Within Functional Limits  Recommendations for other services: Other: none  Discharge Criteria: Patient will be discharged from  PT if patient refuses treatment 3 consecutive times without medical reason, if treatment goals not met, if there is a change in medical status, if patient makes no progress towards goals or if patient is discharged from hospital.  The above assessment, treatment plan, treatment alternatives and goals were discussed and mutually agreed upon: by patient and by family  PT treatment [id number] 60  Minutes  No c/o pain.  Initial eval completed.  Standing balance without AD with reaching for objects, retrieving objects off floor, horseshoe toss. Pt without LOB during these activities.  Dynamic gait training with obstacle negotiation and stepping over obstacles.  Pt with frequent LOB with stepping over obstacles, requires mod A to correct.  Pt demos decreased motor planning, decreased awareness and decreased insight into deficits.  Educated pt and family that pt is not to get up without assist.  Individual Therapy   Berle Fitz 06/12/2011, 12:19 PM

## 2011-06-12 NOTE — Progress Notes (Signed)
Patient information reviewed and entered into [employer] system by Connelly Spruell, RN, CRRN, PPS Coordinator.  Information including medical coding and functional independence measure will be reviewed and updated through discharge.    

## 2011-06-12 NOTE — Progress Notes (Signed)
Physical Therapy Session Note  Patient Details  Name: Carl Gilbert MRN: [id number] Date of Birth: [birth year]  Today's Date: 06/12/2011 Time: 9629-5284 Time Calculation (min): 25 min  Precautions: Precautions Precautions: Fall Precaution Comments: bed alarm, quick release belt Required Braces or Orthoses: No Restrictions Weight Bearing Restrictions: No  Short Term Goals: PT Short Term Goal 1: Pt min A functional transfers PT Short Term Goal 2: Pt gait in controlled environment 150' with min A PT Short Term Goal 3: Pt min A for dynamic standing balance with functional activities  Skilled Therapeutic Interventions: Tx focused on transfers, neuromuscular re-ed, gait training and dynamic standing balance activities.  WC mobility using both feet for neuromuscular re-ed, x 100' x 2.  SPT with close S to w/c, typical bed, sofa, armless kitchen chair, arm chair.  Dynamic standing balance/ short distance gait with turns: using both hands to place clothes on hangers, walk 5' x 8  to rod without AD, with S, with occasional LOB regained independently. Gait training backwards x 8' x 3 with close S, VCs for increasing stance width.  Foot taps onto 6" high stool with RLE for neuromuscular working on foot clearance.  W/c > recliner at end of session, with phone and call bell within reach; wife returning to room momentarily.   General Chart Reviewed: Yes   Pain Pain Assessment Pain Assessment: No/denies pain   Therapy/Group: Individual Therapy  Alec Mcphee 06/12/2011, 3:40 PM

## 2011-06-12 NOTE — Progress Notes (Signed)
[facility] Individual Statement of Services  Patient Name:  Carl Gilbert  Date:  06/12/2011  Welcome to the [facility].  Our goal is to provide you with an individualized program based on your diagnosis and situation, designed to meet your specific needs.  With this comprehensive rehabilitation program, you will be expected to participate in at least 3 hours of rehabilitation therapies Monday-Friday, with modified therapy programming on the weekends.  Your rehabilitation program will include the following services:  Physical Therapy (PT), Occupational Therapy (OT), Speech Therapy (ST), 24 hour per day rehabilitation nursing, Therapeutic Recreaction (TR), Case Management (RN and Child psychotherapist), Rehabilitation Medicine, Nutrition Services and Pharmacy Services  Weekly team conferences will be held on Tuesday  to discuss your progress.  Your RN Case Designer, television/film set will talk with you frequently to get your input and to update you on team discussions.  Team conferences with you and your family in attendance may also be held.  Depending on your progress and recovery, your program may change.  Your RN Case Estate agent will coordinate services and will keep you informed of any changes.  Your RN Sports coach and SW names and contact numbers are listed  below.  The following services may also be recommended but are not provided by the [facility]:   Driving Evaluations  Home Health Rehabiltiation Services  Outpatient Rehabilitatation [facility]  Vocational Rehabilitation   Arrangements will be made to provide these services after discharge if needed.  Arrangements include referral to agencies that provide these services.  Your insurance has been verified to be:  none Your primary doctor is:  none  Pertinent information will be shared with your doctor and your insurance company.  Case Manager: Melanee Spry, [facility]  [phone]  Social Worker:  [place], [place] [phone]  Information discussed with pt & fiancee and copy given to them by: Brock Ra, 06/12/2011, 3:52 PM

## 2011-06-12 NOTE — Progress Notes (Signed)
Speech Language Pathology Speech Language Pathology Assessment and Plan  SLP Diagnosis: Aphasia and cognitive impairment  Clinical Impressions: Administered cognitive-linguistic evaluation today. Pt presents to CIR with a moderate expressive and recpetive aphasia impacting all four language modalaties. Pt also presents with impaired cognitive function impacting functional problem solving,  attention,  and execuative functioning.  Pt currently tolerating a regular diet with thin liquids without overt s/s of aspiration. Pt would benefit from skilled SLP services to maximize functional  communication and cognitive function to incerase overall independence  for d/c home.   Rehab Potential: good for goals  ELOS: 2 weeks  Short-Term Goals 1. Pt will express basic wants needs with moderate A with multimodal cueing.  2. Pt will follow basic 2-step direction with Max A visual cues  3. Pt will demonstrate emergent awareness of expressive deficits during functional communication task with moderate multimodal cues.  4. Demonstrate attention to right environment and body during functional tasks with Mod A verbal and question cues  5. Demonstrate functional problem solving with basic and familiar tasks with Mod A visual and tactile cues.   Therapy Start Time/End Time: Time Calculation Start Time: 0800 Stop Time: 0900 Time Calculation (min): 60 min  Pain: Pain Assessment Pain Assessment: No/denies pain Pain Score: 0-No pain Prior Functioning:  Prior Functional Status Type of Home: House Lives With: Significant other (girlfriend/ fiance) Receives Help From: Family Cognition: Cognition Overall Cognitive Status: Impaired Arousal/Alertness: Awake/alert Orientation Level: Oriented X4 Attention: Sustained;Selective;Alternating;Divided Sustained Attention: Impaired Sustained Attention Impairment: Functional basic;Verbal basic Selective Attention: Impaired Selective Attention Impairment: Verbal  basic;Functional basic Alternating Attention: Impaired Alternating Attention Impairment: Functional basic Divided Attention: Impaired Divided Attention Impairment: Functional basic Memory: Impaired Memory Impairment: Decreased recall of new information Awareness: Impaired Awareness Impairment: Intellectual impairment;Emergent impairment;Anticipatory impairment Problem Solving: Impaired Problem Solving Impairment: Functional basic;Verbal basic Executive Function: Reasoning;Sequencing;Organizing;Decision Making;Self Monitoring;Self Correcting Reasoning: Impaired Reasoning Impairment: Functional basic Sequencing: Impaired Sequencing Impairment: Functional basic Organizing: Impaired Organizing Impairment: Functional basic Decision Making: Impaired Decision Making Impairment: Functional basic Initiating: Appears intact Self Monitoring: Impaired Self Monitoring Impairment: Functional basic;Verbal basic Self Correcting: Impaired Self Correcting Impairment: Functional basic;Verbal basic Behaviors: Impulsive;Poor frustration tolerance Safety/Judgment: Impaired Comments: decreased insight and awareness, needs cues for selective attention Comprehension: Auditory Comprehension Yes/No Questions: Impaired Basic Biographical Questions: 76-100% accurate Basic Immediate Environment Questions: 75-100% accurate Commands: Impaired One Step Basic Commands: 75-100% accurate Two Step Basic Commands: 0-24% accurate Conversation: Simple Interfering Components: Attention EffectiveTechniques: Visual/Gestural cues;Repetition Visual Recognition/Discrimination Discrimination: Within Function Limits Reading Comprehension Reading Status: Impaired Word level: Within functional limits Sentence Level: Impaired Expression: Expression Primary Mode of Expression: Verbal Verbal Expression Initiation: No impairment Automatic Speech: Month of year;Singing Level of Generative/Spontaneous Verbalization:  Phrase Repetition: Impaired Level of Impairment: Word level Naming: Impairment Responsive: 0-25% accurate Confrontation: Impaired Convergent: 25-49% accurate Divergent: Not tested Verbal Errors: Phonemic paraphasias;Perseveration;Aware of errors Pragmatics: No impairment Effective Techniques: Written cues;Phonemic cues;Articulatory cues Written Expression Dominant Hand: Right Written Expression: Not tested Oral/Motor: Oral Motor/Sensory Function Labial Strength: Reduced (on Right) Lingual ROM: Within Functional Limits Motor Speech Intelligibility: Intelligible Assessment/Plan: Assessment Clinical Impression Statement: Pt presents to CIR with a moderate expressive and recpetive aphasia impacting all four language modalaties. Pt also presents with impaired cognitive function impacting functional problem solving,  attention,  and execuative functioning.  Pt currently tolerating a regular diet with thin liquids without overt s/s of aspiration. Pt would benefit from skilled SLP services to maximize functional  communication and cognitive function to incerase overall independence  for d/c home.  SLP Recommendation/Assessment: All further Speech Lanaguage Pathology  needs can be addressed in the next venue of care (while on CIR) Problem List: Auditory comprehension;Reading comprehension;Written expression;Verbal expression;Problem Solving;Attention;Executive Functioning Therapy Diagnosis: Aphasia;Cognitive Impairments Type of Aphasia: Unable to differentiate diagnosis Plan Speech Therapy Frequency: min 5x/week Duration: 2 weeks Treatment/Interventions: Language facilitation;Environmental controls;Cueing hierarchy;Cognitive reorganization;Multimodal communcation approach;Functional tasks;Internal/external aids;SLP instruction and feedback;Compensatory strategies;Patient/family education Potential to Achieve Goals: Good Recommendation Follow up Recommendations: Outpatient SLP Equipment  Recommended: None recommended by SLP Individuals Consulted Consulted and Agree with Results and Recommendations: Patient FIM: Comprehension Comprehension Mode: Auditory;Visual Comprehension: 3-Understands basic 50 - 74% of the time/requires cueing 25 - 50%  of the time Expression Expression Mode: Verbal;Nonverbal Expression: 3-Expresses basic 50 - 74% of the time/requires cueing 25 - 50% of the time. Needs to repeat parts of sentences. Social Interaction Social Interaction Mode: Not assessed Social Interaction: 5-Interacts appropriately 90% of the time - Needs monitoring or encouragement for participation or interaction. Problem Solving Problem Solving Mode: Not assessed Problem Solving: 2-Solves basic 25 - 49% of the time - needs direction more than half the time to initiate, plan or complete simple activities Memory Memory Mode: Not assessed Memory: 3-Recognizes or recalls 50 - 74% of the time/requires cueing 25 - 49% of the time   Recommendations for other services: N/A  Discharge Criteria: Patient will be discharged from SLP if patient refuses treatment 3 consecutive times without medical reason, if treatment goals not met, if there is a change in medical status, if patient makes no progress towards goals or if patient is discharged from hospital.  The above assessment, treatment plan, treatment alternatives and goals were discussed and mutually agreed upon: by patient    Tnia Anglada 06/12/2011 3:09 PM

## 2011-06-12 NOTE — Progress Notes (Signed)
Patient ID: Carl Gilbert, male   DOB: Nov 07, 1950, 60 y.o.   MRN: [id number] Subjective/Complaints: farily good night. Review of Systems  Neurological: Positive for sensory change and speech change.  All other systems reviewed and are negative.      Objective: Vital Signs: Blood pressure 147/95, pulse 63, temperature 97.7 F (36.5 C), temperature source Oral, resp. rate 19, height 5\' 7"  (1.702 m), weight 81.3 kg (179 lb 3.7 oz), SpO2 100.00%. No results found.  Basename 06/12/11 0625  WBC 8.5  HGB 14.8  HCT 45.1  PLT 175   No results found for this basename: NA:2,K:2,CL:2,CO2:2,GLUCOSE:2,BUN:2,CREATININE:2,CALCIUM:2 in the last 72 hours CBG (last 3)  No results found for this basename: GLUCAP:3 in the last 72 hours  Wt Readings from Last 3 Encounters:  06/11/11 81.3 kg (179 lb 3.7 oz)  06/06/11 81.3 kg (179 lb 3.7 oz)  06/06/11 81.3 kg (179 lb 3.7 oz)    Physical Exam:  General appearance: alert, cooperative and no distress Head: Normocephalic, without obvious abnormality, atraumatic Eyes: conjunctivae/corneas clear. PERRL, EOM's intact. Fundi benign. Ears: normal TM's and external ear canals both ears Nose: Nares normal. Septum midline. Mucosa normal. No drainage or sinus tenderness. Throat: mucosa pink and moist.  dentition borderline/poor Neck: no adenopathy, no carotid bruit, no JVD, supple, symmetrical, trachea midline and thyroid not enlarged, symmetric, no tenderness/mass/nodules Back: symmetric, no curvature. ROM normal. No CVA tenderness. Resp: clear to auscultation bilaterally Cardio: regular rate and rhythm, S1, S2 normal, no murmur, click, rub or gallop GI: soft, non-tender; bowel sounds normal; no masses,  no organomegaly Extremities: extremities normal, atraumatic, no cyanosis or edema Pulses: 2+ and symmetric Skin: Skin color, texture, turgor normal. No rashes or lesions Neurologic: patient with expressive aphasia and apraxia.  ? Mild receptive aphasia  also.  Still with difficulty initiating movements of right upper extremity.  Right side with slight sensory loss.  Central 7,5,12 noted on right.  Strength grossly 4/5 right upper extremity. 4+5 right lower extremity. Incision/Wound: n/a   Assessment/Plan: 1. Functional deficits secondary to left MCA infarct which require 3+ hours per day of interdisciplinary therapy in a comprehensive inpatient rehab setting. Physiatrist is providing close team supervision and 24 hour management of active medical problems listed below. Physiatrist and rehab team continue to assess barriers to discharge/monitor patient progress toward functional and medical goals. Mobility:         ADL:   Cognition: Cognition Orientation Level: Oriented X4 Cognition Orientation Level: Oriented X4  Therapy evals underway  Medical Problem List and Plan:  1.Left MCA infarct/aphasia.Evaluat and treat.Continue aspirin.Follow up TEE results.   1. DVT Prophylaxis/Anticoagulation: Lovenox.monitor platelet count and any bleeding episodes.Follow up labs within normal limits so far. .No current signs of DVT.Support hose to be applied.   2. Pain Management: tylenol.No complaints of pain.   3.HTN.Toprol 12.5 mg daily.Monitor with increased activity. Provide education to family/patient regarding BP control.  Pressure borderline today.  4.Tobacco abuse/marijuna use.Provide counseling and education.   3. Mood: ego support    LOS (Days) 1  Myalee Stengel T 06/12/2011, 8:10 AM

## 2011-06-12 NOTE — Progress Notes (Signed)
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 year old male with hypertension admitted November 21 with altered mental status and slurred speech. Cranial CT scan was negative for acute changes. Patient did not receive TPA. 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 CTA Of head showed a MCA branch occlusion in the left brain. Echocardiogram with ejection fraction of 65% without wall motion abnormalities. Patient placed on aspirin and subcutaneous Lovenox for deep vein thrombosis prophylaxis stroke prophylaxis. TEE pending.   Patient transferred to CIR on 06/11/2011 .  Pt currently presents to CIR with right visual and body inattention, decreased sensation in right UE/LE, decreased gross and fine motor coordination in right UE, decreased motor planning/apraxia, decreased safety awareness, expressive and receptive language deficits, decreased simple problem solving, intellectual awareness, self correction, decreased standing balance and decr balance reactions etc.  Patient currently requires max with basic self-care skills secondary to muscle weakness, impaired timing and sequencing, unbalanced muscle activation, motor apraxia, decreased coordination and decreased motor planning, decreased visual perceptual skills, decreased midline orientation, decreased attention to right, right side neglect and decreased motor planning and decreased initiation, decreased attention, decreased awareness, decreased problem solving, decreased safety awareness, decreased memory and delayed processing.  Prior to hospitalization, patient could complete ADLs with  I.  Patient will benefit from skilled intervention to increase independence with basic self-care skills prior to discharge home with care partner.  Anticipate patient will require 24 hour supervision and follow up outpatient.  OT Assessment Rehab Potential: Good OT Plan OT Frequency: 1-2 X/day, 60-90 minutes Estimated Length of Stay: 2 weeks OT Treatment/Interventions: Ambulation/gait training;Community reintegration;Functional mobility training;Self Care/advanced ADL retraining;Therapeutic Exercise;UE/LE Strength taining/ROM;Neuromuscular re-education;DME/adaptive equipment instruction;Balance/vestibular training;Cognitive remediation/compensation;Patient/family education;Therapeutic Activities;Pain management;UE/LE Coordination activities;Visual/perceptual remediation/compensation  Precautions/Restrictions  Precautions Precautions: Fall Precaution Comments: bed alarm, quick release belt Required Braces or Orthoses: No Restrictions Weight Bearing Restrictions: No General Chart Reviewed: Yes Family/Caregiver Present: Yes (Fiance) Vital Signs   Pain Pain Assessment Pain Assessment: No/denies pain Home Living/Prior Functioning Home Living Lives With: Significant other (girlfriend/ fiance) Receives Help From: Family Type of Home: House Home Layout: One level Home Access: Stairs to enter Secretary/administrator of Steps: 4 Bathroom Shower/Tub: Engineer, manufacturing systems: Standard Prior Function Level of Independence: Independent with basic ADLs Able to Take Stairs?: Yes Driving: Yes ADL ADL Grooming: Moderate cueing;Minimal assistance Where Assessed-Grooming: Standing at sink Upper Body Bathing: Maximal assistance;Maximal cueing Where Assessed-Upper Body Bathing: Shower Lower Body Bathing: Maximal assistance;Maximal cueing Where Assessed-Lower Body Bathing: Shower Upper Body Dressing: Maximal assistance Where Assessed-Upper Body Dressing: Edge of bed Lower Body  Dressing: Moderate assistance;Maximal cueing Where Assessed-Lower Body Dressing: Edge of bed Toileting: Moderate cueing;Moderate assistance Where Assessed-Toileting: Teacher, adult education: Curator Method: Proofreader: Acupuncturist: Minimal assistance;Moderate cueing Film/video editor Method: Warden/ranger:  (3:1) ADL Comments: Wife assisted with OT direction with showering Vision/Perception  Vision - History Baseline Vision: Wears glasses only for reading Patient Visual Report: Peripheral vision impairment Vision - Assessment Eye Alignment: Within Functional Limits Vision Assessment: Vision tested Tracking/Visual Pursuits: Requires cues, head turns, or add eye shifts to track Visual Fields: Right visual field  deficit Additional Comments: Pt with rt side visual inattention, strong left gaze preference  Perception Perception: Impaired Inattention/Neglect: Does not attend to right visual field;Does not attend to right side of body Praxis Praxis: Impaired Praxis Impairment Details: Motor planning;Perseveration  Cognition Overall Cognitive Status: Impaired Arousal/Alertness: Awake/alert Orientation Level: Oriented X4 Attention: Sustained;Selective;Alternating;Divided Sustained Attention: Impaired Sustained Attention Impairment: Functional basic;Verbal basic Selective Attention: Impaired Selective Attention Impairment: Verbal basic;Functional basic Alternating Attention: Impaired Alternating Attention Impairment: Functional basic Divided Attention: Impaired Divided Attention Impairment: Functional basic Memory: Impaired Memory Impairment: Decreased recall of new information Awareness: Impaired Awareness Impairment: Intellectual impairment;Emergent impairment;Anticipatory impairment Problem Solving: Impaired Problem Solving Impairment: Functional basic;Verbal basic Executive  Function: Reasoning;Sequencing;Organizing;Decision Making;Self Monitoring;Self Correcting Reasoning: Impaired Reasoning Impairment: Functional basic Sequencing: Impaired Sequencing Impairment: Functional basic Organizing: Impaired Organizing Impairment: Functional basic Decision Making: Impaired Decision Making Impairment: Functional basic Initiating: Appears intact Self Monitoring: Impaired Self Monitoring Impairment: Functional basic;Verbal basic Self Correcting: Impaired Self Correcting Impairment: Functional basic;Verbal basic Behaviors: Impulsive;Poor frustration tolerance Safety/Judgment: Impaired Comments: decreased insight and awareness, needs cues for selective attention Sensation Sensation Light Touch: Impaired by gross assessment Stereognosis: Impaired by gross assessment Hot/Cold: Appears Intact Proprioception: Impaired by gross assessment Additional Comments: right UE impairment sensation and propriception Coordination Gross Motor Movements are Fluid and Coordinated: No Fine Motor Movements are Fluid and Coordinated: No Coordination and Movement Description: right UE with decreased gross and fine motor coordination with functional tasks Finger Nose Finger Test: decreased on the right Motor  Motor Motor: Hemiplegia;Motor apraxia;Abnormal postural alignment and control;Motor perseverations Motor - Skilled Clinical Observations: Decreased coordination of movements with R UE and LE Mobility  Bed Mobility Supine to Sit: 5: Supervision Transfers Transfers: Yes Sit to Stand: 4: Min assist Sit to Stand Details: Tactile cues for sequencing;Tactile cues for placement;Tactile cues for initiation;Tactile cues for posture;Verbal cues for precautions/safety Stand to Sit: 4: Min assist  Trunk/Postural Assessment  Cervical Assessment Cervical Assessment: Within Functional Limits Thoracic Assessment Thoracic Assessment: Within Functional Limits Lumbar Assessment Lumbar  Assessment: Within Functional Limits Postural Control Postural Control:  (leans to the left with prolonged standing c functional task)  Balance Balance Balance Assessed: Yes Static Sitting Balance Static Sitting - Balance Support: Feet supported Static Sitting - Level of Assistance: 5: Stand by assistance Static Standing Balance Static Standing - Balance Support: During functional activity Static Standing - Level of Assistance: 4: Min assist Dynamic Standing Balance Dynamic Standing - Balance Support: During functional activity Dynamic Standing - Level of Assistance: 4: Min assist;3: Mod assist Dynamic Standing - Balance Activities: Lateral lean/weight shifting;Forward lean/weight shifting;Reaching for objects Extremity/Trunk Assessment RUE Assessment RUE Assessment: Exceptions to [facility] RUE AROM (degrees) Overall AROM Right Upper Extremity: Within functional limits for tasks performed (pt reports feeling heavy with higher ROM) RUE Strength RUE Overall Strength: Within Functional Limits for tasks performed (4/5) LUE Assessment LUE Assessment: Within Functional Limits  Recommendations for other services: Other: none at this time  Discharge Criteria: Patient will be discharged from OT if patient refuses treatment 3 consecutive times without medical reason, if treatment goals not met, if there is a change in medical status, if patient makes no progress towards goals or if patient is discharged from hospital.  The above assessment, treatment plan, treatment alternatives and goals were discussed and mutually agreed upon: by patient and by family  Treatment session:  [phone](56min) 1:1 session: OT eval completed, educated fiance and pt on OT purpose, role and goals.  [name] fiance is a NT at [employer] nursing home.  Self care retraining at shower level; focus on attention to right visually and body, stand pivot, standing balance with attention to feet placement, functional ambulation with RW  (had been using on acute).  Educated fiance on functional ambulation with the RW to bathroom. Pt needed max cuing with extra time to complete each functional task. Emphasized to fiance presenting items or sitting on pt's right side to encourage attention to right.   Melonie Florida 06/12/2011, 3:01 PM

## 2011-06-13 MED ORDER — METOPROLOL SUCCINATE ER 25 MG PO TB24
25.0000 mg | ORAL_TABLET | Freq: Every day | ORAL | Status: DC
  Administered 2011-06-13 – 2011-06-19 (×7): 25 mg via ORAL
  Filled 2011-06-13 (×8): qty 1

## 2011-06-13 NOTE — Progress Notes (Signed)
Occupational Therapy Session Note  Patient Details  Name: Carl Gilbert MRN: [id number] Date of Birth: 03/01/1951  Today's Date: 06/13/2011 Time: 1400-1430 Time Calculation (min): 30 min  Precautions: Precautions Precautions: Fall Precaution Comments: bed alarm, quick release belt Required Braces or Orthoses: No Restrictions Weight Bearing Restrictions: No   Skilled Therapeutic Interventions/Progress Updates:    Pt engaged in therapeutic activity to focus on functional ambulation, static standing balance, and w/c safety awareness to increase independence with ADLS.    Pt min assist for ambulating from room to gym with min verbal cues due to difficulty with RLE.   Pt min assist with dynamic standing balance for reaching and tossing activity to encourage RUE movements.       Pain Pain Assessment Pain Assessment: No/denies pain    Therapy/Group: Individual Therapy  Janett Billow 06/13/2011, 3:05 PM

## 2011-06-13 NOTE — Progress Notes (Signed)
Physical Therapy Note  Patient Details  Name: Carl Gilbert MRN: [id number] Date of Birth: 1950/12/31 Today's Date: 06/13/2011  Time: 1300-1357 57 minutes  No c/o pain.  W/c in controlled environment 150' with cues for attention to R side.  Pt with decreased sustained attention to task.  Standing dynamic balance activity with ball kick with min A. Pt with LOB occasionally with R stance, kicking with L LE  Requiring min A to correct.  Standing cone tap reciprocally to work on motor coordination, grading movements, standing balance, sustained attention.  Pt required min A for balance, able to improve ability to grade movements with repetition of task.  Car transfer with verbal/visual cues for safe technique. Pt min A for car transfer. Gait training in controlled and household environment with obstacle negotiation. Cues for problem solving obstacles, min A for balance.  Pt increased gait distances today to >150'.  Pt continues with decreased awareness, impulsivity, low frustration tolerance. Activity tolerance improving  Individual therapy   Jeremy Ditullio 06/13/2011, 3:58 PM

## 2011-06-13 NOTE — Progress Notes (Signed)
Recreational Therapy Assessment and Plan  Patient Details  Name: Majid Mccravy MRN: [id number] Date of Birth: 05-01-1951  Rehab Potential:  Good ELOS: 7-10 days  Assessment CLINICAL IMPRESSION: Patient is a 60 y.o. year old male with hypertension admitted November 21 with altered mental status and slurred speech. Cranial CT scan was negative for acute changes. Patient did not receive TPA. 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 CTA Of head showed a MCA branch occlusion in the left brain. Echocardiogram with ejection fraction of 65% without wall motion abnormalities. Patient placed on aspirin and subcutaneous Lovenox for deep vein thrombosis prophylaxis stroke prophylaxis. TEE pending.  Patient transferred to CIR on 06/11/2011   Pt presents with right inattention, decreased awareness, decreased problem solving, decreased balance, decreased activity tolerance, decreased functional mobility limiting pt's independence with leisure/community pursuits.    Plan  min 1 time per week for >20 minutes   Discharge Criteria: Patient will be discharged from TR if patient refuses treatment 3 consecutive times without medical reason.  If treatment goals not met, if there is a change in medical status, if patient makes no progress towards goals or if patient is discharged from hospital.  The above assessment, treatment plan, treatment alternatives and goals were discussed and mutually agreed upon: by patient  Savreen Gebhardt 06/13/2011, 9:02 AM

## 2011-06-13 NOTE — Progress Notes (Signed)
Speech Language Pathology Progress Notes   Short-Term Goals  1. Pt will express basic wants needs with moderate A with multimodal cueing.  2. Pt will follow basic 2-step direction with Max A visual cues  3. Pt will demonstrate emergent awareness of expressive deficits during functional communication task with moderate multimodal cues.  4. Demonstrate attention to right environment and body during functional tasks with Mod A verbal and question cues  5. Demonstrate functional problem solving with basic and familiar tasks with Mod A visual and tactile cues.    Skilled Therapeutic Interventions/Progress Updates:  Pt joined by friend, Algernon Huxley, for treatment.  Expressed basic ideas with max assist to use multiple modalities to enhance expression.  Reluctant to use gestures/drawing but was stimulable for gestures with max prompts. Using written time line able to accurately point out the number of years he worked at various jobs and the [employer] of time he has known Furniture conservator/restorer.  Produced several single words and basic phrases spontaneously; required max visual/verbal cues to repeat target words and use later in session (inconsistent productions consistent with apraxic component).  Pt showed emerging recognition of errored responses and often asked "Say it again" before his attempts to restate targets.  Required initial cue to attend to right side when propelling w/c down hall; recognized next two barriers on right with no cues. Max assist to follow one and two-step novel commands - written instructions assisted understanding. Pt with good sense of humor and motivated to improve speech.   Therapy Start Time/End Time: Time Calculation Start Time: 1130 Stop Time: 1230 Time Calculation (min): 60 min  Pain: Pain Assessment Pain Assessment: No/denies pain Pain Score: 0-No pain    Therapy/Group: Individual Therapy     Blenda Mounts Laurice 06/13/2011 1:01 PM

## 2011-06-13 NOTE — Progress Notes (Signed)
Patient ID: Carl Gilbert, male   DOB: December 26, 1950, 60 y.o.   MRN: [id number] Patient ID: Carl Gilbert, male   DOB: April 30, 1951, 60 y.o.   MRN: [id number] Subjective/Complaints: farily good night. Review of Systems  Neurological: Positive for sensory change and speech change.  All other systems reviewed and are negative.      Objective: Vital Signs: Blood pressure 154/88, pulse 72, temperature 98.1 F (36.7 C), temperature source Oral, resp. rate 18, height 5\' 7"  (1.702 m), weight 81.3 kg (179 lb 3.7 oz), SpO2 100.00%. No results found.  Basename 06/12/11 1130 06/12/11 0625  WBC 10.4 8.5  HGB 15.6 14.8  HCT 45.0 45.1  PLT 201 175    Basename 06/12/11 0625  NA 139  K 4.0  CL 103  CO2 24  GLUCOSE 88  BUN 18  CREATININE 0.95  CALCIUM 9.2   CBG (last 3)  No results found for this basename: GLUCAP:3 in the last 72 hours  Wt Readings from Last 3 Encounters:  06/11/11 81.3 kg (179 lb 3.7 oz)  06/06/11 81.3 kg (179 lb 3.7 oz)  06/06/11 81.3 kg (179 lb 3.7 oz)    Physical Exam:  General appearance: alert, cooperative and no distress Head: Normocephalic, without obvious abnormality, atraumatic Eyes: conjunctivae/corneas clear. PERRL, EOM's intact. Fundi benign. Ears: normal TM's and external ear canals both ears Nose: Nares normal. Septum midline. Mucosa normal. No drainage or sinus tenderness. Throat: mucosa pink and moist.  dentition borderline/poor Neck: no adenopathy, no carotid bruit, no JVD, supple, symmetrical, trachea midline and thyroid not enlarged, symmetric, no tenderness/mass/nodules Back: symmetric, no curvature. ROM normal. No CVA tenderness. Resp: clear to auscultation bilaterally Cardio: regular rate and rhythm, S1, S2 normal, no murmur, click, rub or gallop GI: soft, non-tender; bowel sounds normal; no masses,  no organomegaly Extremities: extremities normal, atraumatic, no cyanosis or edema Pulses: 2+ and symmetric Skin: Skin color, texture, turgor  normal. No rashes or lesions Neurologic: patient with expressive aphasia and apraxia.  ? Mild receptive aphasia also.  Still with difficulty initiating movements of right upper extremity.  Right side with slight sensory loss.  Central 7,5,12 noted on right.  Strength grossly 4/5 right upper extremity. 4+5 right lower extremity. Incision/Wound: n/a   Assessment/Plan: 1. Functional deficits secondary to left MCA infarct which require 3+ hours per day of interdisciplinary therapy in a comprehensive inpatient rehab setting. Physiatrist is providing close team supervision and 24 hour management of active medical problems listed below. Physiatrist and rehab team continue to assess barriers to discharge/monitor patient progress toward functional and medical goals.    Reviewed potential LOS with patient today.  Mobility: Bed Mobility Supine to Sit: 5: Supervision Transfers Transfers: Yes Sit to Stand: 4: Min assist Stand to Sit: 4: Min assist Stand Pivot Transfers: 4: Min assist Ambulation/Gait Ambulation/Gait Assistance: 3: Mod assist Ambulation/Gait Assistance Details (indicate cue type and reason): Slight foot drag R LE, slight buckling R LE- pt able to self correct, trendelenber gait on R Ambulation Distance (Feet): 75 Feet Assistive device: None Stairs: Yes Stairs Assistance: 3: Mod assist Stairs Assistance Details (indicate cue type and reason): cues to advance R UE along rail Stair Management Technique: Two rails Number of Stairs: 10  Wheelchair Mobility Wheelchair Mobility: Yes Wheelchair Assistance: 5: Investment banker, operational: Both lower extermities ADL:   Cognition: Cognition Overall Cognitive Status: Impaired Arousal/Alertness: Awake/alert Orientation Level: Oriented X4 Attention: Sustained;Selective;Alternating;Divided Sustained Attention: Impaired Sustained Attention Impairment: Functional basic;Verbal basic Selective Attention: Impaired Selective Attention  Impairment: Verbal  basic;Functional basic Alternating Attention: Impaired Alternating Attention Impairment: Functional basic Divided Attention: Impaired Divided Attention Impairment: Functional basic Memory: Impaired Memory Impairment: Decreased recall of new information Awareness: Impaired Awareness Impairment: Intellectual impairment;Emergent impairment;Anticipatory impairment Problem Solving: Impaired Problem Solving Impairment: Functional basic;Verbal basic Executive Function: Reasoning;Sequencing;Organizing;Decision Making;Self Monitoring;Self Correcting Reasoning: Impaired Reasoning Impairment: Functional basic Sequencing: Impaired Sequencing Impairment: Functional basic Organizing: Impaired Organizing Impairment: Functional basic Decision Making: Impaired Decision Making Impairment: Functional basic Initiating: Appears intact Self Monitoring: Impaired Self Monitoring Impairment: Functional basic;Verbal basic Self Correcting: Impaired Self Correcting Impairment: Functional basic;Verbal basic Behaviors: Impulsive;Poor frustration tolerance Safety/Judgment: Impaired Comments: decreased insight and awareness, needs cues for selective attention Cognition Arousal/Alertness: Awake/alert Orientation Level: Oriented X4   Medical Problem List and Plan:  1.Left MCA infarct/aphasia.Evaluat and treat.Continue aspirin.  1. DVT Prophylaxis/Anticoagulation: Lovenox.monitor platelet count and any bleeding episodes.Follow up labs within normal limits so far. .No current signs of DVT.Support hose to be applied.   2. Pain Management: tylenol.No complaints of pain.   3.HTN.Toprol 12.5 mg daily.Monitor with increased activity. Provide education to family/patient regarding BP control.  Will increase toprol to 25mg  4.Tobacco abuse/marijuna use.Provide counseling and education.   3. Mood: ego support    LOS (Days) 2  Kalkidan Caudell T 06/13/2011, 7:53 AM
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
## 2011-06-13 NOTE — Progress Notes (Signed)
Occupational Therapy Session Note  Patient Details  Name: Carl Gilbert MRN: [id number] Date of Birth: August 23, 1950  Today's Date: 06/13/2011 Time: 0930-1015 Time Calculation (min): 45 min  Precautions: Precautions Precautions: Fall Precaution Comments: bed alarm, quick release belt Required Braces or Orthoses: No Restrictions Weight Bearing Restrictions: No   Skilled Therapeutic Interventions/Progress Updates:    Upon arrival pt sitting up in w/c.  Pt declined dressing this morning; nursing assisted pt with dressing.  Pt functional ambulated with min assist and min verbal cues to advance  RLE .  Pt demonstrated difficulty with instructions and expression him-self..  Pt with min assist and mod verbal/tactile cues simulated shower chair transfers in/out tub/shower.  Pt standing with min assist to complete upper/lower extremities activities to challenge pt standing balance and to incorporate RUE.     Focus on functional ambulation, safety awareness, motor planning, discharge planning, and activity tolerance/endurance to increase independence with ADLS.       Pain Pain Assessment Pain Assessment: No/denies pain     Therapy/Group: Individual Therapy  Janett Billow 06/13/2011, 11:31 AM

## 2011-06-14 DIAGNOSIS — Z5189 Encounter for other specified aftercare: Secondary | ICD-10-CM

## 2011-06-14 DIAGNOSIS — I6330 Cerebral infarction due to thrombosis of unspecified cerebral artery: Secondary | ICD-10-CM

## 2011-06-14 MED ORDER — SIMVASTATIN 20 MG PO TABS
20.0000 mg | ORAL_TABLET | Freq: Every day | ORAL | Status: DC
  Administered 2011-06-14 – 2011-06-20 (×7): 20 mg via ORAL
  Filled 2011-06-14 (×9): qty 1

## 2011-06-14 NOTE — Progress Notes (Signed)
Occupational Therapy Session Note  Patient Details  Name: Bayler Gehrig MRN: [id number] Date of Birth: [birth year]  Today's Date: 06/14/2011 Time 4098-11914 60 min  Precautions: Precautions Precautions: Fall Precaution Comments: bed alarm, quick release belt Required Braces or Orthoses: No Restrictions Weight Bearing Restrictions: No   Skilled Therapeutic Interventions/Progress Updates:   Upon arrival pt sitting up in w/c therapist asked why orange belt was not on, pt said he did not need it.   Pt declined bathing and dressing pt stated had already completed task.   Therapist stated importance for bathing and dressing pt agreed to do ADL on Monday.    Pt functional ambulated with min assist from room to gym.   Pt close supervision  In qudraped  on mat to complete therapeutic activities to engage RUE in WB, trunk control, and reaching through left arm to increase UE strengthening and trunk control.  Pt supervision sitting  EOM to work on  fine and gross motor.  Focus on fine/gross motor, activity tolerance/endurance, functional ambulation, and safety awareness to increase independence with ADLS.    Pain Pain Assessment Pain Assessment: No/denies pain Pain Score: 0-No pain    Therapy/Group: Individual Therapy  Janett Billow 06/14/2011, 10:29 AM

## 2011-06-14 NOTE — Progress Notes (Signed)
Progress Notes  Speech Language Pathology Therapy Note  Patient Details  Name: Carl Gilbert MRN: [id number] Date of Birth: [birth year]  Today's Date: 06/14/2011 Time: 9811-9147 Time Calculation (min): 40 min  Precautions: Precautions Precautions: Fall Precaution Comments: bed alarm, quick release belt Required Braces or Orthoses: No Restrictions Weight Bearing Restrictions: No  Short Term Goals: 1. Pt will express basic wants needs with moderate A with multimodal cueing.  2. Pt will follow basic 2-step direction with Max A visual cues  3. Pt will demonstrate emergent awareness of expressive deficits during functional communication task with moderate multimodal cues.  4. Demonstrate attention to right environment and body during functional tasks with Mod A verbal and question cues  5. Demonstrate functional problem solving with basic and familiar tasks with Mod A visual and tactile cues.   Skilled Therapeutic Interventions/Progress Updates: Session focused on getting to know each other with biographical information.  SLP facilitated expression and comprehension with simple phrases, written supplement of key words and repeats moderate assist level with emerging to automatic responses at times.  Demonstrated use of gestures and pictures with max assist encouragement and no attempts to initiate.      Pain Pain Assessment Pain Assessment: No/denies pain  Therapy/Group: Individual Therapy  Charlane Ferretti., CCC-SLP [phone] Carl Gilbert 06/14/2011 4:20 PM

## 2011-06-14 NOTE — Progress Notes (Signed)
Physical Therapy Session Note  Patient Details  Name: Carl Gilbert MRN: [id number] Date of Birth: 21-Sep-1950  Today's Date: 06/14/2011 Time: 1030-1059 Time Calculation (min): 29 min  Precautions: Precautions Precautions: Fall Precaution Comments: bed alarm, quick release belt Required Braces or Orthoses: No Restrictions Weight Bearing Restrictions: No  Short Term Goals: PT Short Term Goal 1: Pt min A functional transfers PT Short Term Goal 2: Pt gait in controlled environment 150' with min A PT Short Term Goal 3: Pt min A for dynamic standing balance with functional activities  Skilled Therapeutic Interventions/Progress Updates:      General   Vital Signs   Pain Pain Assessment Pain Assessment: No/denies pain Mobility   Locomotion gait training focusing on increase in gait speed, increase arm swing rt., head turns vertical and horizontal. Some instability especially looking rt.. Dynamic gait focusing on stepping forwards, backwards, and sideways over board all min assist. Gait bouncing ball for divided attention min assist.    Trunk/Postural Assessment     Balance Balance Balance Assessed: Yes Standardized Balance Assessment Standardized Balance Assessment: Berg Balance Test Berg Balance Test Sit to Stand: Able to stand  independently using hands Standing Unsupported: Able to stand safely 2 minutes Sitting with Back Unsupported but Feet Supported on Floor or Stool: Able to sit safely and securely 2 minutes Stand to Sit: Sits safely with minimal use of hands Transfers: Able to transfer safely, minor use of hands Standing Unsupported with Eyes Closed: Able to stand 10 seconds safely Standing Ubsupported with Feet Together: Able to place feet together independently and stand for 1 minute with supervision From Standing, Reach Forward with Outstretched Arm: Can reach forward >12 cm safely (5") From Standing Position, Pick up Object from Floor: Able to pick up shoe,  needs supervision From Standing Position, Turn to Look Behind Over each Shoulder: Looks behind one side only/other side shows less weight shift Turn 360 Degrees: Able to turn 360 degrees safely but slowly Standing Unsupported, Alternately Place Feet on Step/Stool: Able to complete >2 steps/needs minimal assist Standing Unsupported, One Foot in Front: Loses balance while stepping or standing Standing on One Leg: Unable to try or needs assist to prevent fall Total Score: 38    Exercises    Other Treatments    Therapy/Group: Individual Therapy  Julian Reil 06/14/2011, 11:54 AM

## 2011-06-14 NOTE — Progress Notes (Signed)
Occupational Therapy Session Note  Patient Details  Name: Carl Gilbert MRN: [id number] Date of Birth: 10-18-1950  Today's Date: 06/14/2011 Time: 1300-1330 Time Calculation (min): 30 min  Precautions: Precautions Precautions: Fall Precaution Comments: bed alarm, quick release belt Required Braces or Orthoses: No Restrictions Weight Bearing Restrictions: No    Skilled Therapeutic Interventions/Progress Updates:    Upon arrival pt sitting up in w/c.  Pt functional ambulated from room to gym with steady assist.  Pt engaged in therapeutic activity to focus on dynamic standing balance, UE strengthening, functional ambulation, safety awareness, and activity tolerance/endurance to increase independence with ADLS.  Reminded pt that on Monday therapist would be in to go over bathing and dressing.       Pain Pain Assessment Pain Assessment: No/denies pain    Therapy/Group: Individual Therapy  Janett Billow 06/14/2011, 1:30 PM

## 2011-06-14 NOTE — Progress Notes (Signed)
Patient ID: Carl Gilbert, male   DOB: 1950/11/24, 60 y.o.   MRN: [id number] Subjective/Complaints: farily good night. Pt not aware of elevated cholesterol in past , has not been on a statin in the past per his report.Aphasic with sparse verbal output Review of Systems  Neurological: Positive for sensory change and speech change.  All other systems reviewed and are negative.      Objective: Vital Signs: Blood pressure 146/91, pulse 73, temperature 97.4 F (36.3 C), temperature source Oral, resp. rate 20, height 5\' 7"  (1.702 m), weight 81.3 kg (179 lb 3.7 oz), SpO2 99.00%. No results found.  Basename 06/12/11 1130 06/12/11 0625  WBC 10.4 8.5  HGB 15.6 14.8  HCT 45.0 45.1  PLT 201 175    Basename 06/12/11 0625  NA 139  K 4.0  CL 103  CO2 24  GLUCOSE 88  BUN 18  CREATININE 0.95  CALCIUM 9.2   CBG (last 3)  No results found for this basename: GLUCAP:3 in the last 72 hours  Wt Readings from Last 3 Encounters:  06/11/11 81.3 kg (179 lb 3.7 oz)  06/06/11 81.3 kg (179 lb 3.7 oz)  06/06/11 81.3 kg (179 lb 3.7 oz)    Physical Exam:  General appearance: alert, cooperative and no distress Head: Normocephalic, without obvious abnormality, atraumatic Eyes: conjunctivae/corneas clear. PERRL, EOM's intact. Fundi benign. Ears: normal TM's and external ear canals both ears Nose: Nares normal. Septum midline. Mucosa normal. No drainage or sinus tenderness. Throat: mucosa pink and moist.  dentition borderline/poor Neck: no adenopathy, no carotid bruit, no JVD, supple, symmetrical, trachea midline and thyroid not enlarged, symmetric, no tenderness/mass/nodules Back: symmetric, no curvature. ROM normal. No CVA tenderness. Resp: clear to auscultation bilaterally Cardio: regular rate and rhythm, S1, S2 normal, no murmur, click, rub or gallop GI: soft, non-tender; bowel sounds normal; no masses,  no organomegaly Extremities: extremities normal, atraumatic, no cyanosis or edema Pulses:  2+ and symmetric Skin: Skin color, texture, turgor normal. No rashes or lesions Neurologic: patient with expressive aphasia and apraxia.  ? Mild receptive aphasia also.  Still with difficulty initiating movements of right upper extremity.  Right side with slight sensory loss.  Central 7,5,12 noted on right.  Strength grossly 4/5 right upper extremity. 4+5 right lower extremity. Incision/Wound: n/a   Assessment/Plan: 1. Functional deficits secondary to left MCA infarct which require 3+ hours per day of interdisciplinary therapy in a comprehensive inpatient rehab setting. Physiatrist is providing close team supervision and 24 hour management of active medical problems listed below. Physiatrist and rehab team continue to assess barriers to discharge/monitor patient progress toward functional and medical goals.    Reviewed potential LOS with patient today.  Mobility: Bed Mobility Supine to Sit: 5: Supervision Transfers Transfers: Yes Sit to Stand: 4: Min assist Stand to Sit: 4: Min assist Stand Pivot Transfers: 4: Min assist Ambulation/Gait Ambulation/Gait Assistance: 3: Mod assist Ambulation/Gait Assistance Details (indicate cue type and reason): Slight foot drag R LE, slight buckling R LE- pt able to self correct, trendelenber gait on R Ambulation Distance (Feet): 75 Feet Assistive device: None Stairs: Yes Stairs Assistance: 3: Mod assist Stairs Assistance Details (indicate cue type and reason): cues to advance R UE along rail Stair Management Technique: Two rails Number of Stairs: 10  Wheelchair Mobility Wheelchair Mobility: Yes Wheelchair Assistance: 5: Investment banker, operational: Both lower extermities ADL:   Cognition: Cognition Overall Cognitive Status: Impaired Arousal/Alertness: Awake/alert Orientation Level: Oriented X4 Attention: Sustained;Selective;Alternating;Divided Sustained Attention: Impaired Sustained Attention Impairment: Functional  basic;Verbal  basic Selective Attention: Impaired Selective Attention Impairment: Verbal basic;Functional basic Alternating Attention: Impaired Alternating Attention Impairment: Functional basic Divided Attention: Impaired Divided Attention Impairment: Functional basic Memory: Impaired Memory Impairment: Decreased recall of new information Awareness: Impaired Awareness Impairment: Intellectual impairment;Emergent impairment;Anticipatory impairment Problem Solving: Impaired Problem Solving Impairment: Functional basic;Verbal basic Executive Function: Reasoning;Sequencing;Organizing;Decision Making;Self Monitoring;Self Correcting Reasoning: Impaired Reasoning Impairment: Functional basic Sequencing: Impaired Sequencing Impairment: Functional basic Organizing: Impaired Organizing Impairment: Functional basic Decision Making: Impaired Decision Making Impairment: Functional basic Initiating: Appears intact Self Monitoring: Impaired Self Monitoring Impairment: Functional basic;Verbal basic Self Correcting: Impaired Self Correcting Impairment: Functional basic;Verbal basic Behaviors: Impulsive;Poor frustration tolerance Safety/Judgment: Impaired Comments: decreased insight and awareness, needs cues for selective attention Cognition Arousal/Alertness: Awake/alert Orientation Level: Oriented X4   Medical Problem List and Plan:  1.Left MCA infarct/aphasia.R hemiparesis Evaluat and treat.Continue aspirin.Add Zocor  1. DVT Prophylaxis/Anticoagulation: Lovenox.monitor platelet count and any bleeding episodes.Follow up labs within normal limits so far. .No current signs of DVT.Support hose to be applied.   2. Pain Management: tylenol.No complaints of pain.   3.HTN.Toprol 12.5 mg daily.Monitor with increased activity. Provide education to family/patient regarding BP control.  Will increase toprol to 25mg  4.Tobacco abuse/marijuna use.Provide counseling and education.   3. Mood: ego support    LOS  (Days) 3  Albertina Leise E 06/14/2011, 8:30 AM

## 2011-06-14 NOTE — Progress Notes (Signed)
Pt alert and oriented times 4 with expressive asphasia.  Pt denies pain.  Pt took off orange safety release belt to use the bathroom and than reapplied it.  RN spoke to pt and educated pt on the importance of using the belt and calling for help.  Pt verbalized an understanding,  Bed alarm placed while pt in bed.  Pt transfers to the toilet and had a bowel movement and is continent of urine Carl Gilbert

## 2011-06-14 NOTE — Progress Notes (Signed)
Speech Pathology:  Treatment Note  10:30-10:59 Individual Treatment Session Subjective:  Pt seen for aphasia treatment today. Pt was pleasant and eager to participate. Pt also demonstrated humor appropriately.   Objective:  Pt independently expressed need to utilize cloth for right side drooling. Also independently expressed need for reading glasses during reading activities today.   Pt read single words with picture cues: I - 2/10; with phonemic and phrase completion cues: 4/10   Pt followed two step simple, functional directives with minimal cues given at 100% accuracy.  Pt demonstrated awareness of speech and word finding difficulty "I can't say it" with minimal verbal cues from therapist.  Pt needed mod verbal and gestural cues for removing cloth from in front of face/lips during his speech tasks as the therapist could not hear him/understand him with cloth in front of mouth.   Attention to right side during simple picture and object scanning tasks with glasses on was 100% with independence.  Pt demonstrated functional problem solving skills for call bell use with independence; counting 8 coins completed with mod to max assist.      Assessment: Pt is progressing towards short term goals.   Recommendations: Continue speech therapy per plan of care.   Pain:   none Intervention Required:   No  Goals: progressing   Shadana Pry H. Lucretia Roers MA, CCC-SLP

## 2011-06-15 DIAGNOSIS — I6330 Cerebral infarction due to thrombosis of unspecified cerebral artery: Secondary | ICD-10-CM

## 2011-06-15 DIAGNOSIS — Z5189 Encounter for other specified aftercare: Secondary | ICD-10-CM

## 2011-06-15 NOTE — Progress Notes (Signed)
Subjective: frustrated with expressive aphasia but denies pain - no new complaints  Objective: Vital signs in last 24 hours: Temp:  [97.5 F (36.4 C)] 97.5 F (36.4 C) (12/01 0532) Pulse Rate:  [65-73] 65  (12/01 0532) Resp:  [18-20] 20  (12/01 0532) BP: (123-160)/(81-93) 123/81 mmHg (12/01 0532) SpO2:  [96 %-98 %] 98 % (12/01 0532) Weight change:  Last BM Date: 06/14/11  Intake/Output from previous day: 11/30 0701 - 12/01 0700 In: 720 [P.O.:720] Out: -  Last cbgs: CBG (last 3)  No results found for this basename: GLUCAP:3 in the last 72 hours   Physical Exam General: No apparent distress    Lungs: Normal effort. Lungs clear to auscultation, no crackles or wheezes. Cardiovascular: Regular rate and rhythm, no edema Neurological: No new neurological deficits - expressive aphasia without change Wounds: Clean, dry, intact. No signs of infection.   Lab Results:  Basename 06/12/11 1130  WBC 10.4  HGB 15.6  HCT 45.0  PLT 201   BMET No results found for this basename: NA:2,K:2,CL:2,CO2:2,GLUCOSE:2,BUN:2,CREATININE:2,CALCIUM:2 in the last 72 hours  Studies/Results: No results found.  Medications: I have reviewed the patient's current medications.  Assessment/Plan: 1. Functional deficits secondary to left MCA infarct with expressive aphasia which require 3+ hours per day of interdisciplinary therapy in a comprehensive inpatient rehab setting.  Physiatrist is providing close team supervision and 24 hour management of active medical problems listed below.  Physiatrist and rehab team continue to assess barriers to discharge/monitor patient progress toward functional and medical goals.   2. Pain Management: tylenol. No complaints of pain.   3.HTN:  BP Readings from Last 3 Encounters:  06/15/11 123/81  06/11/11 152/80  06/11/11 152/80   Monitor with increased activity.  The current medical regimen is effective;  continue present plan and medications.   4.Tobacco  abuse/marijuna use.Provide counseling and education.     Rene Paci , MD 06/15/2011, 10:52 AM

## 2011-06-15 NOTE — Progress Notes (Signed)
Pt alert/oriented, x4, expressive aphasia noted, prn confusion, poor safety awareness noted, up x2 without asst to BR, instr pt to call for asst with voiced understanding, OOB with one asst to stand by, using bed alarm and quick release belt, continent of bowel/bladder, last BM 11/30, skin intact, no voiced complaints, will continue with plan of care

## 2011-06-15 NOTE — Progress Notes (Signed)
Occupational Therapy Session Note  Patient Details  Name: Carl Gilbert MRN: [id number] Date of Birth: July 16, 1950  Today's Date: 06/15/2011 Time: 0815-0900 Time Calculation (min): 45 min  Precautions: Precautions Precautions: Fall Precaution Comments: bed alarm, quick release belt Required Braces or Orthoses: No Restrictions Weight Bearing Restrictions: No  Short Term Goals: OT Short Term Goal 1: Pt will don UB clothing with min A  once clothing orientated. OT Short Term Goal 2: Pt will bathe 10/10 parts at shower level with min A with max cuing OT Short Term Goal 3: Pt will don LB clothing with min A with max cuing OT Short Term Goal 4: Pt will attend to right side with mod multimodal cues.  Skilled Therapeutic Interventions/Progress Updates: am session  B/d in w/c at sink with resistance from patient due to modesty; focus on safety due to impulsivity and decreased cognition - safety issues;    Pm session:  Right UE neuro re-educa   Pain none   Therapy/Group: Individual Therapy  Bud Face [facility] 06/15/2011, 4:25 PM

## 2011-06-15 NOTE — Progress Notes (Signed)
Speech Language Pathology Therapy Note  Patient Details  Name: Carl Gilbert MRN: [id number] Date of Birth: 01-Mar-1951  Today's Date: 06/15/2011 Time: 4098-1191 Time Calculation (min): 31 min  Precautions: Precautions Precautions: Fall Precaution Comments: bed alarm, quick release belt Required Braces or Orthoses: No Restrictions Weight Bearing Restrictions: No  Short Term Goals:  1. Pt will express basic wants needs with moderate A with multimodal cueing.  2. Pt will follow basic 2-step direction with Max A visual cues  3. Pt will demonstrate emergent awareness of expressive deficits during functional communication task with moderate multimodal cues.  4. Demonstrate attention to right environment and body during functional tasks with Mod A verbal and question cues  5. Demonstrate functional problem solving with basic and familiar tasks with Mod A visual and tactile cues.   Skilled Therapeutic Interventions/Progress Updates: Treatment today focused on naming common objects. Pt independently demonstrated use of 6/6 objects while attempting to name them.  1/6 objects named independently; with phonemic cues, pt was able to name 3/6. Sentence completion tasks was 6/8 with additional phonemic cue x2.  Pt repeated 8/10 common words.  Followed two step directives with no difficulty during picture sequencing tasks. Pt demonstrated good emergent awareness of expressive speech errors and challenges today with minimal prompt for therapist.   Continue therapy per Plan of Care.   Pain Pain Assessment Pain Assessment: No/denies pain Pain Score: 0-No pain   Therapy/Group: Individual Therapy  Waldo Laine 06/15/2011 4:52 PM

## 2011-06-15 NOTE — Progress Notes (Signed)
Physical Therapy Session Note  Patient Details  Name: Carl Gilbert MRN: [id number] Date of Birth: November 08, 1950  Today's Date: 06/15/2011 Time: 1110-1210 and 1335-1400 Time Calculation (min): 85 min  Precautions: Precautions Precautions: Fall Precaution Comments: bed alarm, quick release belt Required Braces or Orthoses: No Restrictions Weight Bearing Restrictions: No  Short Term Goals: PT Short Term Goal 1: Pt min A functional transfers PT Short Term Goal 2: Pt gait in controlled environment 150' with min A PT Short Term Goal 3: Pt min A for dynamic standing balance with functional activities Pain: no c/o pain, but fatigue during session  Skilled Therapeutic Interventions/Progress Updates:   Gait Training- for endurance and balance: 1. 133ft x3 MinA (contact guard) cues for left foot placement.  2. Dynamic gait working on alternate weight shifting with toe taps to cones and reaching to the floor, 10ftx3, Min A. 3.  Stair negotiation working on left side awareness with right hand only on the rail and progressing from step-to-pattern to reciprocal pattern, Supervision with cues for foot placement and sequencing.  Activities for balance (weightshifting and right weight bearing) and extremity strengthening: 1. Tall kneel without UE support with multilevel reaching, Supervision. 2. Quadruped- alteranate LE extensions, MinA . 3. 1/2 kneel with occasional Rt. UE support, static.  4. Bridging- 10x +10 with ball under knees, MinA.  5. Floor transfer with external support, Supervision.  6. Nu-step level 6, all extremities, rest breaks needed to complete 10 min.  Therapy/Group: Individual Therapy  Hortencia Conradi, PTA 06/15/2011, 3:31 PM

## 2011-06-16 NOTE — Progress Notes (Signed)
Patient alert and oriented x 3 - uses call bell appropriately at times. Patient has had quick release and bed alarm on in room. Patient able to ambulate with stand by assist- right side weak. Patient has denied any pain this shift. No skin issues noted. Patient continent bowel and bladder with last bowel movement on 11/30. Some expressive aphasia noted. Continue with plan of care.

## 2011-06-16 NOTE — Progress Notes (Addendum)
Physical Therapy Session Note  Patient Details  Name: Carl Gilbert MRN: [id number] Date of Birth: 07-Jul-1951  Today's Date: 06/16/2011 Time 14:45-15:30 ( )  Precautions: Precautions Precautions: Fall Precaution Comments: bed alarm, quick release belt Required Braces or Orthoses: No Restrictions Weight Bearing Restrictions: No  Short Term Goals: PT Short Term Goal 1: Pt min A functional transfers PT Short Term Goal 2: Pt gait in controlled environment 150' with min A PT Short Term Goal 3: Pt min A for dynamic standing balance with functional activities  Skilled Therapeutic Interventions/Progress Updates:  Tx focused on gait training to increase equal step length and timing with increased pace on treadmill with bil UE A. Pt gait from room to gym >150' with Min A and cues for R heel strike and arm swing. Instructed pt on use of treadmill for safety. Pt walked 1x71min at 1.46ft/sec for 460' and 1x3 min at 1.26ft/sec for 360' after ramp-up time each trial with Min A for steadying. Pt given cues for R heel strike, posture, and R foot clearance. Pt unsure of RvsL, so tactile cues given. Pt unable to achieve heel strike, but instead increase step height as if marching. Pt instructed to listen for equal foot strike timing, and for when R foot drags and to correct, able to correct, but only with cues.  Performed dynamic balance for lateal stepping and backwards walking x20' with Min A for steadying.     No Pain  Therapy/Group: Individual Therapy  Iona Coach, PT 06/16/2011, 3:21 PM

## 2011-06-16 NOTE — Progress Notes (Signed)
Patient ID: Carl Gilbert, male   DOB: 1951/02/12, 60 y.o.   MRN: [id number] Subjective: Still frustrated with expressive aphasia.  No pain - no new complaints. Reports he is "ready to go home"  Objective: Vital signs in last 24 hours: Temp:  [97.8 F (36.6 C)-98.1 F (36.7 C)] 98.1 F (36.7 C) (12/02 0629) Pulse Rate:  [63-86] 63  (12/02 0629) Resp:  [16-18] 16  (12/02 0629) BP: (130-179)/(71-75) 130/71 mmHg (12/02 0629) SpO2:  [99 %] 99 % (12/02 0629) Weight change:  Last BM Date: 06/14/11  Intake/Output from previous day: 12/01 0701 - 12/02 0700 In: 1080 [P.O.:1080] Out: -  Last cbgs: CBG (last 3)  No results found for this basename: GLUCAP:3 in the last 72 hours   Physical Exam General: No apparent distress   Lungs: Normal effort. Lungs clear to auscultation, no crackles or wheezes. Cardiovascular: Regular rate and rhythm, no edema Neurological: No new neurological deficits - expressive aphasia without change   Lab Results: No results found for this basename: WBC:2,HGB:2,HCT:2,PLT:2 in the last 72 hours BMET No results found for this basename: NA:2,K:2,CL:2,CO2:2,GLUCOSE:2,BUN:2,CREATININE:2,CALCIUM:2 in the last 72 hours  Studies/Results: No results found.  Medications: I have reviewed the patient's current medications.  Assessment/Plan: 1. Functional deficits secondary to left MCA infarct with expressive aphasia which require 3+ hours per day of interdisciplinary therapy in a comprehensive inpatient rehab setting.  Physiatrist is providing close team supervision and 24 hour management of active medical problems listed below. Physiatrist and rehab team continue to assess barriers to discharge/monitor patient progress toward functional and medical goals.   2. Pain Management: tylenol. No complaints of pain.   3.HTN:  BP Readings from Last 3 Encounters:  06/16/11 130/71  06/11/11 152/80  06/11/11 152/80   Monitor with increased activity.  The current  medical regimen is effective;  continue present plan and medications.   4.Tobacco abuse/marijuna use.Provide counseling and education.     Lang Snow , PA-C 06/16/2011, 9:38 AM   Patient personally seen and examined by me. Agree as here.  Rene Paci, MD 10:04 AM

## 2011-06-17 DIAGNOSIS — I6330 Cerebral infarction due to thrombosis of unspecified cerebral artery: Secondary | ICD-10-CM

## 2011-06-17 DIAGNOSIS — Z5189 Encounter for other specified aftercare: Secondary | ICD-10-CM

## 2011-06-17 NOTE — Plan of Care (Signed)
Problem: RH PAIN MANAGEMENT Goal: RH STG PAIN MANAGED AT OR BELOW [name] PAIN GOAL Outcome: Progressing Pt denies pain.   

## 2011-06-17 NOTE — Progress Notes (Signed)
Occupational Therapy Session Note  Patient Details  Name: Carl Gilbert MRN: [id number] Date of Birth: 25-Nov-1950  Today's Date: 06/17/2011 Time: 1100-1130 Time Calculation (min): 30 min  Precautions: Precautions Precautions: Fall Precaution Comments: bed alarm, quick release belt Required Braces or Orthoses: No Restrictions Weight Bearing Restrictions: No   Skilled Therapeutic Interventions/Progress Updates:    Pt engaged in therapeutic activity while standing for 20 minutes playing wii bowling.   Pt required mod verbal/tactile cues with hand placement with remote.   Pt needed 2 rest breaks.  Pt close supervision functional ambulated from gym to room with min verbal cues to pick up right foot.   Focus on dynamic standing balance, functional ambulation, fine/gross mototr and activity tolerance/endurance to increase independence with ADLS.       Pain Pain Assessment Pain Assessment: No/denies pain       Therapy/Group: Individual Therapy  Rich Brave 06/17/2011, 11:40 AM

## 2011-06-17 NOTE — Progress Notes (Signed)
Pt alert and oriented with expressive asphasia.  Pt is able to make needs known.  Pt denies pain,  Pt transfers with supervision from the wheelchair to the bed/ toilet.  Pt continent of bowel and bladder.  Pt denies any concerns.  Safety belt in place while in the wheelchair and bed alarm on while in bed. Carl Gilbert

## 2011-06-17 NOTE — Progress Notes (Signed)
Patient Details  Name: Tunis Gentle MRN: [id number] Date of Birth: 1950-10-08  Today's Date: 06/17/2011 Time: 10-10:35  Skilled Therapeutic Interventions/Progress Updates: Pt ambulated forward and backward while tossing or bounce-passing a basketball with contact guard asssit.  Pt ambulated on level/unlevel surfaces, indoors & outdoors on grass, mulch, up and down steps, up and down-hill on mulched lawn with close supervision-contact guard in preparation for community pursuits.  Pt was able to pick golf balls up from the ground with contact guard assist.  Pt required verbal cues to attend to right hand during tasks.  No c/o pain.  Therapy/Group: Other: co-treat with PT  Activity Level: Moderate:  Level of assist: Min Assist  Karalee Hauter 06/17/2011, 12:04 PM

## 2011-06-17 NOTE — Progress Notes (Signed)
Physical Therapy Session Note  Patient Details  Name: Chritopher Coster MRN: [id number] Date of Birth: 06/20/1951  Today's Date: 06/17/2011 Time: 1100-1130 Time Calculation (min): 30 min  Precautions: Precautions Precautions: Fall Precaution Comments: bed alarm, quick release belt Required Braces or Orthoses: No Restrictions Weight Bearing Restrictions: No  Short Term Goals: PT Short Term Goal 1: Pt min A functional transfers PT Short Term Goal 2: Pt gait in controlled environment 150' with min A PT Short Term Goal 3: Pt min A for dynamic standing balance with functional activities  Skilled Therapeutic Interventions/Progress Updates:      General   Vital Signs   Pain Pain Assessment Pain Assessment: No/denies pain Mobility   Locomotion  Ambulation Ambulation/Gait Assistance: 4: Min assist Ambulation Distance (Feet): 500 Feet Ambulation/Gait Assistance Details (indicate cue type and reason): tactile cues for increased rt. arm swing. if pt can relax rt. arm with gait, foot drag on rt. improves Gait Gait Pattern: Step-through pattern;Right steppage;Right foot flat High Level Ambulation High Level Ambulation: Backwards walking (while catching ball) Backwards Walking: while catching ball Stairs / Additional Locomotion Stairs: Yes Stairs Assistance: 4: Min assist Stairs Assistance Details (indicate cue type and reason): pt. varied between step to and alternating pattern on steps in community. Stair Management Technique: One rail Left Number of Stairs: 20  Curb: 4: Min Technical sales engineer Mobility: No  Trunk/Postural Assessment     Balance Balance Balance Assessed: No Dynamic Standing Balance Dynamic Standing - Level of Assistance: 4: Min assist Dynamic Standing - Balance Activities: [employer];Compliant surfaces;Foam balance beam   Exercises    Other Treatments    Therapy/Group: Individual Therapy  Julian Reil 06/17/2011, 12:31  PM

## 2011-06-17 NOTE — Progress Notes (Signed)
Progress Notes  Speech Language Pathology Therapy Note  Patient Details  Name: Carl Gilbert MRN: [id number] Date of Birth: [birth year]  Today's Date: 06/17/2011 Time: 1345-1430 Time Calculation (min): 45 min  Precautions: Precautions Precautions: Fall Precaution Comments: bed alarm, quick release belt Required Braces or Orthoses: No Restrictions Weight Bearing Restrictions: No  Short Term Goals: set 06/12/11 1. Pt will express basic wants needs with moderate A with multimodal cueing.  2. Pt will follow basic 2-step direction with Max A visual cues  3. Pt will demonstrate emergent awareness of expressive deficits during functional communication task with moderate multimodal cues.  4. Demonstrate attention to right environment and body during functional tasks with Mod A verbal and question cues  5. Demonstrate functional problem solving with basic and familiar tasks with Mod A visual and tactile cues.   Skilled Therapeutic Interventions/Progress Updates:  Session focused on increasing verbal expression with use of strategies and cuing. SLP facilitated session with a quiet environment, use of pictures to provide a context as well as visual cues, sentence completion as well as phonemic cues as needed, and written words.  Patient exhibited good awareness of errors via facial expressions and body language, especially with expression of compound words such as: football, toothbrush, sandwich, telephone, etc. SLP facilitated with separating words into simple word productions, use of mirror and demonstration for articulator placement.  Patient attempted writing with left hand with increased wait time patient self corrected and attempted writing with right hand, which was more legible.  Patient independently propelled wheelchair to and from the speech therapy room.  Patient progressing with automatic speech and overall awareness.      Pain Pain Assessment Pain Assessment: No/denies pain Pain  Score: 0-No pain  Oral/Motor: Oral Motor/Sensory Function Labial Strength: Reduced (on Right) Lingual ROM: Within Functional Limits Motor Speech Intelligibility: Intelligible Comprehension: Auditory Comprehension Yes/No Questions: Impaired Basic Biographical Questions: 76-100% accurate Basic Immediate Environment Questions: 75-100% accurate Commands: Impaired One Step Basic Commands: 75-100% accurate Two Step Basic Commands: 0-24% accurate Conversation: Simple Interfering Components: Attention EffectiveTechniques: Visual/Gestural cues;Repetition Visual Recognition/Discrimination Discrimination: Within Function Limits Reading Comprehension Reading Status: Impaired Word level: Within functional limits Sentence Level: Impaired Expression: Expression Primary Mode of Expression: Verbal Verbal Expression Initiation: No impairment Automatic Speech: Month of year;Singing Level of Generative/Spontaneous Verbalization: Phrase Repetition: Impaired Level of Impairment: Word level Naming: Impairment Responsive: 0-25% accurate Confrontation: Impaired Convergent: 25-49% accurate Divergent: Not tested Verbal Errors: Phonemic paraphasias;Perseveration;Aware of errors Pragmatics: No impairment Effective Techniques: Written cues;Phonemic cues;Articulatory cues Written Expression Dominant Hand: Right Written Expression: Not tested  Therapy/Group: Individual Therapy  Charlane Ferretti., CCC-SLP [phone] [name] 06/17/2011 2:49 PM

## 2011-06-17 NOTE — Progress Notes (Signed)
Occupational Therapy Session Note  Patient Details  Name: Eyal Greenhaw MRN: [id number] Date of Birth: May 31, 1951  Today's Date: 06/17/2011 Time: 4098-1191 Time Calculation (min): 58 min  Precautions: Precautions Precautions: Fall Precaution Comments: bed alarm, quick release belt Required Braces or Orthoses: No Restrictions Weight Bearing Restrictions: No   Skilled Therapeutic Interventions/Progress Updates:    ADL retraining including bathing at shower level in room and dressing with sit to stand from toilet.  Pt gathered clothing at w/c level and ambulated into BR for shower transfer at supervision level.  Pt doffed/donned underwear and pants while standing with UE support to steady self while threading pants.  Pt exhibited decreased UE coordination when manipulating deoderant.  Pt gathered soiled clothing from floor and placed in dresser drawers at supervision level.  Pt donned shoes and fasten while sitting in w/c.  Pt with min assist functional ambulated from room to gym.   Pt engaged in therapeutic activity while standing for 20 minutes with 2 rest breaks.  Focused on fine/gross motor, dynamic standing balance,  Sit to stand, and functional ambulation to increase independence with ADLS.           Other Treatments    Therapy/Group: Individual Therapy  Rich Brave 06/17/2011, 9:16 AM

## 2011-06-17 NOTE — Progress Notes (Signed)
Patient ID: Carl Gilbert, male   DOB: [birth year], 60 y.o.   MRN: [id number] Patient ID: Carl Gilbert, male   DOB: [birth year], 60 y.o.   MRN: [id number] Subjective/Complaints: farily good night. Pt not aware of elevated cholesterol in past , has not been on a statin in the past per his report.Aphasic with sparse verbal output Review of Systems  Neurological: Positive for sensory change and speech change.  All other systems reviewed and are negative.      Objective: Vital Signs: Blood pressure 135/86, pulse 63, temperature 98.1 F (36.7 C), temperature source Oral, resp. rate 16, height 5\' 7"  (1.702 m), weight 81.3 kg (179 lb 3.7 oz), SpO2 100.00%. No results found. No results found for this basename: WBC:2,HGB:2,HCT:2,PLT:2 in the last 72 hours No results found for this basename: NA:2,K:2,CL:2,CO2:2,GLUCOSE:2,BUN:2,CREATININE:2,CALCIUM:2 in the last 72 hours CBG (last 3)  No results found for this basename: GLUCAP:3 in the last 72 hours  Wt Readings from Last 3 Encounters:  06/11/11 81.3 kg (179 lb 3.7 oz)  06/06/11 81.3 kg (179 lb 3.7 oz)  06/06/11 81.3 kg (179 lb 3.7 oz)    Physical Exam:  General appearance: alert, cooperative and no distress Head: Normocephalic, without obvious abnormality, atraumatic Eyes: conjunctivae/corneas clear. PERRL, EOM's intact. Fundi benign. Ears: normal TM's and external ear canals both ears Nose: Nares normal. Septum midline. Mucosa normal. No drainage or sinus tenderness. Throat: mucosa pink and moist.  dentition borderline/poor Neck: no adenopathy, no carotid bruit, no JVD, supple, symmetrical, trachea midline and thyroid not enlarged, symmetric, no tenderness/mass/nodules Back: symmetric, no curvature. ROM normal. No CVA tenderness. Resp: clear to auscultation bilaterally Cardio: regular rate and rhythm, S1, S2 normal, no murmur, click, rub or gallop GI: soft, non-tender; bowel sounds normal; no masses,  no organomegaly Extremities:  extremities normal, atraumatic, no cyanosis or edema Pulses: 2+ and symmetric Skin: Skin color, texture, turgor normal. No rashes or lesions Neurologic: patient with expressive aphasia and apraxia.  ? Mild receptive aphasia also.  Still with difficulty initiating movements of right upper extremity.  Right side with slight sensory loss.  Central 7,5,12 noted on right.  Strength grossly 4/5 right upper extremity. 4+5 right lower extremity. Incision/Wound: n/a   Assessment/Plan: 1. Functional deficits secondary to left MCA infarct which require 3+ hours per day of interdisciplinary therapy in a comprehensive inpatient rehab setting. Physiatrist is providing close team supervision and 24 hour management of active medical problems listed below. Physiatrist and rehab team continue to assess barriers to discharge/monitor patient progress toward functional and medical goals.    Reviewed potential LOS with patient today.  Mobility: Bed Mobility Supine to Sit: 5: Supervision Transfers Transfers: Yes Sit to Stand: 4: Min assist Stand to Sit: 4: Min assist Stand Pivot Transfers - DO NOT USE: 4: Min assist Ambulation/Gait Ambulation/Gait Assistance: 4: Min assist Ambulation/Gait Assistance Details (indicate cue type and reason): Slight foot drag R LE, slight buckling R LE- pt able to self correct, trendelenber gait on R Ambulation Distance (Feet): 75 Feet Assistive device: None Stairs: Yes Stairs Assistance: 3: Mod assist Stairs Assistance Details (indicate cue type and reason): cues to advance R UE along rail Stair Management Technique: Two rails Number of Stairs: 10  Wheelchair Mobility Wheelchair Mobility: Yes Wheelchair Assistance: 5: Investment banker, operational: Both lower extermities Distance: 150 ADL:   Cognition: Cognition Overall Cognitive Status: Impaired Arousal/Alertness: Awake/alert Orientation Level: Oriented X4 Attention:  Sustained;Selective;Alternating;Divided Sustained Attention: Impaired Sustained Attention Impairment: Functional basic;Verbal basic Selective Attention: Impaired Selective Attention Impairment:  Verbal basic;Functional basic Alternating Attention: Impaired Alternating Attention Impairment: Functional basic Divided Attention: Impaired Divided Attention Impairment: Functional basic Memory: Impaired Memory Impairment: Decreased recall of new information Awareness: Impaired Awareness Impairment: Intellectual impairment;Emergent impairment;Anticipatory impairment Problem Solving: Impaired Problem Solving Impairment: Functional basic;Verbal basic Executive Function: Reasoning;Sequencing;Organizing;Decision Making;Self Monitoring;Self Correcting Reasoning: Impaired Reasoning Impairment: Functional basic Sequencing: Impaired Sequencing Impairment: Functional basic Organizing: Impaired Organizing Impairment: Functional basic Decision Making: Impaired Decision Making Impairment: Functional basic Initiating: Appears intact Self Monitoring: Impaired Self Monitoring Impairment: Functional basic;Verbal basic Self Correcting: Impaired Self Correcting Impairment: Functional basic;Verbal basic Behaviors: Impulsive;Poor frustration tolerance Safety/Judgment: Impaired Comments: decreased insight and awareness, needs cues for selective attention Cognition Arousal/Alertness: Awake/alert Orientation Level: Oriented X4   Medical Problem List and Plan:  1.Left MCA infarct/aphasia.R hemiparesis Evaluat and treat.Continue aspirin.Add Zocor  1. DVT Prophylaxis/Anticoagulation: Lovenox.monitor platelet count and any bleeding episodes.Follow up labs within normal limits so far. .No current signs of DVT.Support hose to be applied.   2. Pain Management: tylenol.No complaints of pain.   3.HTN.Toprol 12.5 mg daily.Monitor with increased activity. Provide education to family/patient regarding BP control.   Will increase toprol to 25mg  4.Tobacco abuse/marijuna use.Provide counseling and education.   3. Mood: ego support    LOS (Days) 6  Ellyce Lafevers E 06/17/2011, 7:47 AM

## 2011-06-18 NOTE — Progress Notes (Signed)
Occupational Therapy Session Note  Patient Details  Name: Carl Gilbert MRN: [id number] Date of Birth: 1950-07-31  Today's Date: 06/18/2011 Time: 0715-0815 Time Calculation (min): 60 min  Precautions: Precautions Precautions: Fall Precaution Comments: bed alarm, quick release belt Required Braces or Orthoses: No Restrictions Weight Bearing Restrictions: No  Skilled Therapeutic Interventions/Progress Updates:    ADL retraining including bathing at shower level and dressing with sit to stand from toilet.  Pt gathered clothing and ambulated to BR with supervision.  Pt completed all bathing and dressing task sequentially with no verbal cues.   Pt supervision while standing at the sink for grooming.  Pt supervision while sitting in w/c to donn shoes and fasten them.  Pt functional ambulated with close supervision from room to ADL apartment.  Pt supervision for simple meal prep with max verbal/tactile cues to follow through with gathering materials and following instructions.  Pt presented difficulty with sequencing task.  Focus on transfers, sequencing, dynamic standing balance,  functional ambulation, safety awareness, sit to stand, and activity tolerance/endurance to increase independence with ADLS.       Pain Pain Assessment Pain Assessment: No/denies pain  Therapy/Group: Individual Therapy  Rich Brave 06/18/2011, 8:21 AM

## 2011-06-18 NOTE — Progress Notes (Signed)
Physical Therapy Weekly Progress Note  Patient Details  Name: Carl Gilbert MRN: [id number] Date of Birth: 1951/03/27  Today's Date: 06/18/2011 Time: 0715-0815 Time Calculation (min): 60 min  Patient has met 3 of 3 short term goals.  Pt. Initially required mod assist for mobility and demonstrated poor frustration tolerance to activities. Currently pt can perform high level balance with out an AD with minguard assist. Pt. Scored 38/56 on the [facility] Balance assessment.  Pt. Is demonstrating emergent and anticipatory awareness with balance and gait outside with supervision, and has started to make gains n safety awareness and awareness to his right side and space.  Patient continues to demonstrate the following deficits: decreased balance, decreased safety awareness and therefore will continue to benefit from skilled PT intervention to enhance overall performance with activity tolerance, balance, ability to compensate for deficits and awareness.  Patient progressing toward long term goals..  Continue plan of care.  PT Short Term Goals PT Short Term Goal 1: Pt min A functional transfers PT Short Term Goal 1 - Progress: Met PT Short Term Goal 2: Pt gait in controlled environment 150' with min A PT Short Term Goal 2 - Progress: Met PT Short Term Goal 3: Pt min A for dynamic standing balance with functional activities PT Short Term Goal 3 - Progress: Met  Therapy Documentation Precautions: Precautions Precautions: Fall Precaution Comments: bed alarm, quick release belt Required Braces or Orthoses: No Restrictions Weight Bearing Restrictions: No  Skilled Therapeutic Interventions/Progress Updates:      General   Vital Signs Therapy Vitals Temp: 98.6 F (37 C) Temp src: Oral Pulse Rate: 65  Resp: 20  BP: 139/81 mmHg Patient Position, if appropriate: Lying Oxygen Therapy SpO2: 99 % O2 Device: None (Room air) Pain Pain Assessment Pain Assessment: No/denies pain Mobility     Locomotion     Trunk/Postural Assessment     Balance    Exercises    Other Treatments    Therapy/Group: Individual Therapy  Julian Reil 06/18/2011, 9:21 AM

## 2011-06-18 NOTE — Progress Notes (Signed)
Physical Therapy Session Note  Patient Details  Name: Carl Gilbert MRN: [id number] Date of Birth: December 27, 1950  Today's Date: 06/18/2011 Time: 0900-1000 Time Calculation (min): 60 min  Precautions: Precautions Precautions: Fall Precaution Comments: bed alarm, quick release belt Required Braces or Orthoses: No Restrictions Weight Bearing Restrictions: No  Short Term Goals: PT Short Term Goal 1: Pt min A functional transfers PT Short Term Goal 1 - Progress: Met PT Short Term Goal 2: Pt gait in controlled environment 150' with min A PT Short Term Goal 2 - Progress: Met PT Short Term Goal 3: Pt min A for dynamic standing balance with functional activities PT Short Term Goal 3 - Progress: Met  Skilled Therapeutic Interventions/Progress Updates:      General Chart Reviewed: Yes Amount of Missed PT Time (min): 30 Minutes Missed Time Reason: Patient ill (comment) (pt. stated not feeling well, BP meds. just given) Vital Signs Therapy Vitals Pulse Rate: 91  (resting) BP: 152/88 mmHg Patient Position, if appropriate: Sitting Oxygen Therapy SpO2: 100 % O2 Device: None (Room air) Pain Pain Assessment Pain Assessment: No/denies pain Pain Score: 0-No pain Mobility   Locomotion  Ambulation Ambulation/Gait Assistance: 5: Supervision Ambulation Distance (Feet): 150 Feet (x 2) Assistive device: None Ambulation/Gait Assistance Details: Tactile cues for weight shifting Ambulation/Gait Assistance Details (indicate cue type and reason): tactile ckues for relaxation of rt. arm to increase arm swing, VC for less rt. foot drag Wheelchair Mobility Wheelchair Mobility: No  Trunk/Postural Assessment     Balance     Exercises    Other Treatments Treatments Therapeutic Activity: fishing game with cane pole and picking up eggs Neuromuscular Facilitation: Right;Upper Extremity;Activity to increase coordination;Activity to increase sustained activation;Activity to increase grading   Incorporated naming colors with phenomic cues and semantic cues   Therapy/Group: Individual Therapy  Julian Reil 06/18/2011, 12:16 PM

## 2011-06-18 NOTE — Progress Notes (Signed)
Patient Details  Name: Carl Gilbert MRN: [id number] Date of Birth: May 13, 1951  Today's Date: 06/18/2011 Time: 10:35-1100  Skilled Therapeutic Interventions/Progress Updates: Stood for modified fishing activity using right upper extremity working on coordination and naming with supervision for balance and RUE use, and Mod semantic and phonemic cues for naming colors during activity.  No c/o pain.  Therapy/Group: Individual Therapy and Other:   Activity Level: Moderate:  Level of assist: supervision for balance/mod cues for naming   Dougles Kimmey 06/18/2011, 12:49 PM

## 2011-06-18 NOTE — Progress Notes (Signed)
Patient tolerating therapy well. Mild expressive aphasia  Noted . Patient able to communicate effectively . Supervision with stand pvt transfers. Educated patient on increasing safety awareness. Continue with plan of care.                                                                                                           Carl Gilbert

## 2011-06-18 NOTE — Patient Care Conference (Signed)
Inpatient RehabilitationTeam Conference Note Date: 06/18/2011   Time: 2:57pm    Patient Name: Carl Gilbert      Medical Record Number: [id number]  Date of Birth: 1950/09/22 Sex: Male         Room/Bed: 4011/4011-01 Payor Info: Payor:     Admitting Diagnosis: LT CVA  Admit Date/Time:  06/11/2011  4:22 PM Admission Comments: No comment available   Primary Diagnosis:  CVA (cerebral vascular accident) Principal Problem: CVA (cerebral vascular accident)  Patient Active Problem List  Diagnoses Date Noted  . CVA (cerebral vascular accident) 06/12/2011  . ETOH abuse 06/11/2011  . Unspecified cerebral artery occlusion with cerebral infarction 06/05/2011    Class: Acute  . Hypertension 06/05/2011    Class: Chronic  . Smoking addiction 06/05/2011    Class: Present on Admission  . Aphasia complicating stroke 06/05/2011    Class: Acute    Expected Discharge Date: Expected Discharge Date: 06/21/11 (d/c corrected)  Team Members Present: Physician: Dr. Faith Rogue Case Manager Present: Melanee Spry, RN Social Worker Present: Amada Jupiter, LCSW PT Present: Reggy Eye, PT OT Present: Roney Mans, OT;Ardis Rowan, Corky Crafts, OT SLP Present: Feliberto Gottron, SLP Other (Discipline and Name): Tora Duck, PPS Coordinator RN Present: Carmie End    Current Status/Progress Goal Weekly Team Focus  Medical   Patient with left brain stroke. He showing improvement in apraxia. Fluency of speech is much improved.  Consistent communication skills optimization of secondary prevention strategies  Patient education regarding health hygiene and secondary prevention   Bowel/Bladder   continent bowel and bladder  independent with bowel and bladder   continue with plan of care   Swallow/Nutrition/ Hydration             ADL's   min a/supervision overall with verbal cues for attention to right  supervision overall  safety awareness, attention to right, family education   Mobility   min  assist for high level balance, supervision for controlled gait  supervision overall  family ed., attention to rt. , safety awareness   Communication   moderate assist  minimal assist  increase carryover of strategies and self cuing   Safety/Cognition/ Behavioral Observations  quick release belt on in chair bed alarm on in bed   min assist increase safety awareness  monitor increase in safety awareness  and educate for home safety   Pain   n/a         Skin   n/a            *See Interdisciplinary Assessment and Plan and progress notes for long and short-term goals  Barriers to Discharge: None identified    Possible Resolutions to Barriers:       Discharge Planning/Teaching Needs:  Home with girlfriend who has arranged for pt to have 24 hour per day supervision via family and friends.    girlfriend to complete ed on day of discharge   Team Discussion: Pt moving well-language is coming along. Recommend OP therapy f/up.   Revisions to Treatment Plan: none    Continued Need for Acute Rehabilitation Level of Care: The patient requires daily medical management by a physician with specialized training in physical medicine and rehabilitation for the following conditions: Daily direction of a multidisciplinary physical rehabilitation program to ensure safe treatment while eliciting the highest outcome that is of practical value to the patient.: Yes Daily medical management of patient stability for increased activity during participation in an intensive rehabilitation regime.: Yes Daily analysis of laboratory values  and/or radiology reports with any subsequent need for medication adjustment of medical intervention for : Neurological problems;Other  Brock Ra 06/19/2011, 9:28 AM

## 2011-06-18 NOTE — Progress Notes (Signed)
Speech Language Pathology Therapy Note  Patient Details  Name: Carl Gilbert MRN: [id number] Date of Birth: Jan 18, 1951  Today's Date: 06/18/2011 Time: 0900-1000 Time Calculation (min): 60 min  Precautions: Precautions Precautions: Fall Precaution Comments: bed alarm, quick release belt Required Braces or Orthoses: No Restrictions Weight Bearing Restrictions: No   Skilled Therapeutic Interventions: Session focused on increasing verbal expression with use of strategies and cuing. SLP facilitated session with a quiet environment, use of pictures to provide contextual and visual cues, sentence completion, phonemic cues and written cues. Patient needed repitition to increase comprehension of activity. Patient exhibited emergent awareness of errors throughout session. Patient independently propelled wheelchair to and from the speech therapy room. Patient progressing with automatic speech and overall awareness.  Therapy/Group: Individual Therapy  Mauria Asquith 06/18/2011 10:09 AM

## 2011-06-18 NOTE — Progress Notes (Signed)
Occupational Therapy Session Note  Patient Details  Name: Carl Gilbert MRN: [id number] Date of Birth: 10-30-1950  Today's Date: 06/18/2011 Time: 4098-1191 Time Calculation (min): 30 min  Precautions: Precautions Precautions: Fall Precaution Comments: bed alarm, quick release belt Required Braces or Orthoses: No Restrictions Weight Bearing Restrictions: No   Skilled Therapeutic Interventions/Progress Updates:    Upon arrival pt sitting up in w/c.  Pt close supervision with functional ambulating from room to gym.   Pt presented difficulty with communication while trying to explain to therapist about sports.   Pt engaged in therapeutic activity to focus on dynamic standing balance, functional ambulation, reaching, UE strengthening, safety awareness and activity tolerance/endurance to increase independence with ADLS.    Pain Pain Assessment Pain Assessment: No/denies pain Pain Score: 0-No pain   Therapy/Group: Individual Therapy  Janett Billow 06/18/2011, 2:58 PM

## 2011-06-18 NOTE — Progress Notes (Signed)
Patient ID: Markon Jares, male   DOB: [birth year], 60 y.o.   MRN: [id number] Patient ID: Bostyn Bogie, male   DOB: [birth year], 60 y.o.   MRN: [id number] Patient ID: Osric Klopf, male   DOB: Feb 23, 1951, 60 y.o.   MRN: [id number] Subjective/Complaints: farily good night. Review of Systems  Neurological: Positive for sensory change and speech change.  All other systems reviewed and are negative.  Feeling well.    Objective: Vital Signs: Blood pressure 139/81, pulse 65, temperature 98.6 F (37 C), temperature source Oral, resp. rate 20, height 5\' 7"  (1.702 m), weight 81.3 kg (179 lb 3.7 oz), SpO2 99.00%. No results found. No results found for this basename: WBC:2,HGB:2,HCT:2,PLT:2 in the last 72 hours No results found for this basename: NA:2,K:2,CL:2,CO2:2,GLUCOSE:2,BUN:2,CREATININE:2,CALCIUM:2 in the last 72 hours CBG (last 3)  No results found for this basename: GLUCAP:3 in the last 72 hours  Wt Readings from Last 3 Encounters:  06/11/11 81.3 kg (179 lb 3.7 oz)  06/06/11 81.3 kg (179 lb 3.7 oz)  06/06/11 81.3 kg (179 lb 3.7 oz)    Physical Exam:  General appearance: alert, cooperative and no distress Head: Normocephalic, without obvious abnormality, atraumatic Eyes: conjunctivae/corneas clear. PERRL, EOM's intact. Fundi benign. Ears: normal TM's and external ear canals both ears Nose: Nares normal. Septum midline. Mucosa normal. No drainage or sinus tenderness. Throat: mucosa pink and moist.  dentition borderline/poor Neck: no adenopathy, no carotid bruit, no JVD, supple, symmetrical, trachea midline and thyroid not enlarged, symmetric, no tenderness/mass/nodules Back: symmetric, no curvature. ROM normal. No CVA tenderness. Resp: clear to auscultation bilaterally Cardio: regular rate and rhythm, S1, S2 normal, no murmur, click, rub or gallop GI: soft, non-tender; bowel sounds normal; no masses,  no organomegaly Extremities: extremities normal, atraumatic, no cyanosis or  edema Pulses: 2+ and symmetric Skin: Skin color, texture, turgor normal. No rashes or lesions Neurologic: patient with expressive aphasia and apraxia but improved.  More spontaneous with speech and movement. Right side with slight sensory loss.  Central 7,5,12 noted on right.  Strength grossly 4+/5 right upper extremity. 4+5 right lower extremity. Incision/Wound: n/a   Assessment/Plan: 1. Functional deficits secondary to left MCA infarct which require 3+ hours per day of interdisciplinary therapy in a comprehensive inpatient rehab setting. Physiatrist is providing close team supervision and 24 hour management of active medical problems listed below. Physiatrist and rehab team continue to assess barriers to discharge/monitor patient progress toward functional and medical goals.    mobility: Bed Mobility Supine to Sit: 5: Supervision Transfers Transfers: Yes Sit to Stand: 4: Min assist Stand to Sit: 4: Min assist Stand Pivot Transfers - DO NOT USE: 4: Min assist Ambulation/Gait Ambulation/Gait Assistance: 4: Min assist Ambulation/Gait Assistance Details (indicate cue type and reason): tactile cues for increased rt. arm swing. if pt can relax rt. arm with gait, foot drag on rt. improves Ambulation Distance (Feet): 500 Feet Assistive device: None Gait Pattern: Step-through pattern;Right steppage;Right foot flat Stairs: Yes Stairs Assistance: 4: Min assist Stairs Assistance Details (indicate cue type and reason): pt. varied between step to and alternating pattern on steps in community. Stair Management Technique: One rail Left Number of Stairs: 20  Wheelchair Mobility Wheelchair Mobility: No Wheelchair Assistance: 5: Investment banker, operational: Both lower extermities Distance: 150 ADL:   Cognition: Cognition Overall Cognitive Status: Impaired Arousal/Alertness: Awake/alert Orientation Level: Oriented X4 Attention: Sustained;Selective;Alternating;Divided Sustained  Attention: Impaired Sustained Attention Impairment: Functional basic;Verbal basic Selective Attention: Impaired Selective Attention Impairment: Verbal basic;Functional basic Alternating Attention: Impaired Alternating Attention Impairment:  Functional basic Divided Attention: Impaired Divided Attention Impairment: Functional basic Memory: Impaired Memory Impairment: Decreased recall of new information Awareness: Impaired Awareness Impairment: Intellectual impairment;Emergent impairment;Anticipatory impairment Problem Solving: Impaired Problem Solving Impairment: Functional basic;Verbal basic Executive Function: Reasoning;Sequencing;Organizing;Decision Making;Self Monitoring;Self Correcting Reasoning: Impaired Reasoning Impairment: Functional basic Sequencing: Impaired Sequencing Impairment: Functional basic Organizing: Impaired Organizing Impairment: Functional basic Decision Making: Impaired Decision Making Impairment: Functional basic Initiating: Appears intact Self Monitoring: Impaired Self Monitoring Impairment: Functional basic;Verbal basic Self Correcting: Impaired Self Correcting Impairment: Functional basic;Verbal basic Behaviors: Impulsive;Poor frustration tolerance Safety/Judgment: Impaired Comments: decreased insight and awareness, needs cues for selective attention Cognition Arousal/Alertness: Awake/alert Orientation Level: Oriented X4   Medical Problem List and Plan:  1.Left MCA infarct/aphasia.R hemiparesis Evaluat and treat.Continue aspirin.  1. DVT Prophylaxis/Anticoagulation: Lovenox.monitor platelet count and any bleeding episodes.Follow up labs within normal limits so far. .No current signs of DVT.Support hose to be applied.   2. Pain Management: tylenol.No complaints of pain.   3.HTN.Toprol 12.5 mg daily.Monitor with increased activity. Provide education to family/patient regarding BP control.  Will increase toprol to 25mg  4.Tobacco abuse/marijuna  use.Provide counseling and education.   3. Mood: ego support    LOS (Days) 7  SWARTZ,ZACHARY T 06/18/2011, 8:38 AM

## 2011-06-19 DIAGNOSIS — I6330 Cerebral infarction due to thrombosis of unspecified cerebral artery: Secondary | ICD-10-CM

## 2011-06-19 DIAGNOSIS — Z5189 Encounter for other specified aftercare: Secondary | ICD-10-CM

## 2011-06-19 MED ORDER — METOPROLOL SUCCINATE ER 50 MG PO TB24
50.0000 mg | ORAL_TABLET | Freq: Every day | ORAL | Status: DC
  Administered 2011-06-20 – 2011-06-21 (×2): 50 mg via ORAL
  Filled 2011-06-19 (×3): qty 1

## 2011-06-19 NOTE — Progress Notes (Signed)
Patient ID: Jaylene Schrom, male   DOB: 17-Feb-1951, 60 y.o.   MRN: [id number] Patient ID: Asante Blanda, male   DOB: 1951-07-13, 60 y.o.   MRN: [id number] Patient ID: Chirstopher Iovino, male   DOB: March 08, 1951, 60 y.o.   MRN: [id number] Patient ID: Damere Brandenburg, male   DOB: 23-Jul-1950, 60 y.o.   MRN: [id number] Subjective/Complaints: farily good night. Review of Systems  Neurological: Positive for sensory change and speech change.  All other systems reviewed and are negative.  Feeling well.    Objective: Vital Signs: Blood pressure 142/97, pulse 64, temperature 97.5 F (36.4 C), temperature source Oral, resp. rate 20, height 5\' 7"  (1.702 m), weight 81.3 kg (179 lb 3.7 oz), SpO2 99.00%. No results found. No results found for this basename: WBC:2,HGB:2,HCT:2,PLT:2 in the last 72 hours No results found for this basename: NA:2,K:2,CL:2,CO2:2,GLUCOSE:2,BUN:2,CREATININE:2,CALCIUM:2 in the last 72 hours CBG (last 3)  No results found for this basename: GLUCAP:3 in the last 72 hours  Wt Readings from Last 3 Encounters:  06/11/11 81.3 kg (179 lb 3.7 oz)  06/06/11 81.3 kg (179 lb 3.7 oz)  06/06/11 81.3 kg (179 lb 3.7 oz)    Physical Exam:  General appearance: alert, cooperative and no distress Head: Normocephalic, without obvious abnormality, atraumatic Eyes: conjunctivae/corneas clear. PERRL, EOM's intact. Fundi benign. Ears: normal TM's and external ear canals both ears Nose: Nares normal. Septum midline. Mucosa normal. No drainage or sinus tenderness. Throat: mucosa pink and moist.  dentition borderline/poor Neck: no adenopathy, no carotid bruit, no JVD, supple, symmetrical, trachea midline and thyroid not enlarged, symmetric, no tenderness/mass/nodules Back: symmetric, no curvature. ROM normal. No CVA tenderness. Resp: clear to auscultation bilaterally Cardio: regular rate and rhythm, S1, S2 normal, no murmur, click, rub or gallop GI: soft, non-tender; bowel sounds normal; no masses,   no organomegaly Extremities: extremities normal, atraumatic, no cyanosis or edema Pulses: 2+ and symmetric Skin: Skin color, texture, turgor normal. No rashes or lesions Neurologic: patient with expressive aphasia and apraxia but improved.  More spontaneous with speech and movement. Right side with slight sensory loss.  Central 7,5,12 noted on right.  Strength grossly 4+/5 right upper extremity. 4+5 right lower extremity. Incision/Wound: n/a   Assessment/Plan: 1. Functional deficits secondary to left MCA infarct which require 3+ hours per day of interdisciplinary therapy in a comprehensive inpatient rehab setting. Physiatrist is providing close team supervision and 24 hour management of active medical problems listed below. Physiatrist and rehab team continue to assess barriers to discharge/monitor patient progress toward functional and medical goals.    Working toward d/c Friday.  mobility: Bed Mobility Supine to Sit: 5: Supervision Transfers Transfers: Yes Sit to Stand: 4: Min assist Stand to Sit: 4: Min assist Stand Pivot Transfers - DO NOT USE: 4: Min assist Ambulation/Gait Ambulation/Gait Assistance: 5: Supervision Ambulation/Gait Assistance Details (indicate cue type and reason): tactile ckues for relaxation of rt. arm to increase arm swing, VC for less rt. foot drag Ambulation Distance (Feet): 150 Feet (x 2) Assistive device: None Gait Pattern: Step-through pattern;Right steppage;Right foot flat Stairs: Yes Stairs Assistance: 4: Min assist Stairs Assistance Details (indicate cue type and reason): pt. varied between step to and alternating pattern on steps in community. Stair Management Technique: One rail Left Number of Stairs: 20  Wheelchair Mobility Wheelchair Mobility: No Wheelchair Assistance: 5: Investment banker, operational: Both lower extermities Distance: 150 ADL:   Cognition: Cognition Overall Cognitive Status: Impaired Arousal/Alertness:  Awake/alert Orientation Level: Oriented X4 Attention: Sustained;Selective;Alternating;Divided Sustained Attention: Impaired Sustained Attention Impairment:  Functional basic;Verbal basic Selective Attention: Impaired Selective Attention Impairment: Verbal basic;Functional basic Alternating Attention: Impaired Alternating Attention Impairment: Functional basic Divided Attention: Impaired Divided Attention Impairment: Functional basic Memory: Impaired Memory Impairment: Decreased recall of new information Awareness: Impaired Awareness Impairment: Intellectual impairment;Emergent impairment;Anticipatory impairment Problem Solving: Impaired Problem Solving Impairment: Functional basic;Verbal basic Executive Function: Reasoning;Sequencing;Organizing;Decision Making;Self Monitoring;Self Correcting Reasoning: Impaired Reasoning Impairment: Functional basic Sequencing: Impaired Sequencing Impairment: Functional basic Organizing: Impaired Organizing Impairment: Functional basic Decision Making: Impaired Decision Making Impairment: Functional basic Initiating: Appears intact Self Monitoring: Impaired Self Monitoring Impairment: Functional basic;Verbal basic Self Correcting: Impaired Self Correcting Impairment: Functional basic;Verbal basic Behaviors: Impulsive;Poor frustration tolerance Safety/Judgment: Impaired Comments: decreased insight and awareness, needs cues for selective attention Cognition Arousal/Alertness: Awake/alert Orientation Level: Oriented X4   Medical Problem List and Plan:  1.Left MCA infarct/aphasia.R hemiparesis Evaluat and treat.Continue aspirin.  1. DVT Prophylaxis/Anticoagulation: Lovenox.monitor platelet count and any bleeding episodes. Patient also ambulating quite a bit now  2. Pain Management: tylenol.No complaints of pain.   3.HTN.Toprol 25mg .  Will increase to 50mg  watching closely for bradycardia.  4.Tobacco abuse/marijuna use.Provide counseling and  education.   3. Mood: ego support    LOS (Days) 8  Chetan Mehring T 06/19/2011, 8:51 AM

## 2011-06-19 NOTE — Progress Notes (Signed)
Patient alert and oriented x 3 - uses call bell appropriately at times. Patient has had quick release and bed alarm on in room due to being impulsive at times. Patient able to stand pivot with supervision - right side weak. Patient has denied any pain this shift. No skin issues noted. Patient continent bowel and bladder with last bowel movement on 12/2. Some expressive aphasia noted. Continue with plan of care.

## 2011-06-19 NOTE — Progress Notes (Signed)
Physical Therapy Session Note  Patient Details  Name: Carl Gilbert MRN: [id number] Date of Birth: May 03, 1951  Today's Date: 06/19/2011 Time:  1025-1100 and 1400-1455 Time Calculation (min):35 min and 55 min  Precautions: Precautions Precautions: Fall Precaution Comments: bed alarm, quick release belt Required Braces or Orthoses: No Restrictions Weight Bearing Restrictions: No  Short Term Goals: PT Short Term Goal 1: Pt min A functional transfers PT Short Term Goal 1 - Progress: Met PT Short Term Goal 2: Pt gait in controlled environment 150' with min A PT Short Term Goal 2 - Progress: Met PT Short Term Goal 3: Pt min A for dynamic standing balance with functional activities PT Short Term Goal 3 - Progress: Met  Skilled Therapeutic Interventions: AM and PM  treatments focused on high level gait training and dynamic standing balance.  W/c mobility x 120' with S, for increased activity tolerance, BLE coordination.   Gait training x 250' working on improved clearance of RLE with increased speed, VCs and auditory cues to clear foot; 10 m timed walk test = 7 seconds, with HHA for safety (community ambulator).  Neuromuscular re-ed during therex for RLE, in standing to tap feet 10 x 1 each, on 6'' high stool via manual cues, visual cues, verbal cues.  Dynamic balance in standing on dynamic surface during RUE reaching and placment tasks requiring body rotation and reaching across midline, x 8 to L and x 8 to R, without LOB.  Squatting to retrieve items from floor with R hand, x 10; reaching overhead with RUE for retrieval task facilitating full R knee extension, close S. Closed chain bilateral LE hip abduction 10 x 1 each, min A for safety. Gait training for increased cadence back to room; pt left in w/c with safety belt on. General Chart Reviewed: Yes Pain Pain Assessment Pain Assessment: No/denies pain      Therapy/Group: Individual Therapy  Latise Dilley 06/19/2011, 4:07 PM

## 2011-06-19 NOTE — Progress Notes (Signed)
Patient ID: Carl Gilbert, male   DOB: 03-18-1951, 60 y.o.   MRN: [id number] Pt participating & progressing in txs.  Team set d/c for 06/21/11 after morning therapies/education w/ gf.  Yesterday afternoon spoke w/ pt and w/ pt's gf, Annette, to [employer] report.  They are in agreement w/ plans for Friday 06/21/11, except, gf says the friend who will be staying w/ pt while she is at work cannot provide txport to OP--will need HH f/up.  For medical f/up an appt has been made w/  [facility].

## 2011-06-19 NOTE — Progress Notes (Signed)
Occupational Therapy Session Note  Patient Details  Name: Carl Gilbert MRN: [id number] Date of Birth: 17-Jan-1951  Today's Date: 06/19/2011 Time: 0715-0815 Time Calculation (min): 60 min  Precautions: Precautions Precautions: Fall Precaution Comments: bed alarm, quick release belt Required Braces or Orthoses: No Restrictions Weight Bearing Restrictions: No   Skilled Therapeutic Interventions/Progress Updates:    Upon arrival pt sitting in w/c at sink finishing up grooming task.  Pt declined ADL session for bathing and dressing secondary to have completed task already.  Pt functional ambulated with supervision from room to ADL apartment.  Noted pt not dragging right foot today while ambulating.  Pt completed simple stove top activity with max verbal cues to assist with materials needed and to follow instructions.  Pt able to gather and put away items needed with supervision.  Pt  Presented safety awareness while completing task on stove.  Pt initiated BUE use with activity.   Pt stood for 40 minutes at counter to complete task with one rest break.  Focus on functional ambulation, safety awareness, energy conservation, dynamic standing balance, and activity tolerance/endurance to increase independence with ADLS.     Pain Pain Assessment Pain Assessment: No/denies pain     Therapy/Group: Individual Therapy  Rich Brave 06/19/2011, 8:29 AM

## 2011-06-19 NOTE — Progress Notes (Signed)
Speech Language Pathology Progress & Therapy Note  Patient Details  Name: Carl Gilbert MRN: [id number] Date of Birth: Jun 23, 1951  Today's Date: 06/19/2011 Time: 0900-1000 Time Calculation (min): 60 min  Precautions: Precautions Precautions: Fall Precaution Comments: bed alarm, quick release belt Required Braces or Orthoses: No Restrictions Weight Bearing Restrictions: No  Short Term Goals: 1. Pt will express basic wants needs with moderate A with multimodal cueing.  Goal Met 2. Pt will follow basic 2-step direction with Max A visual cues Goal Met 3. Pt will demonstrate emergent awareness of expressive deficits during functional communication task with moderate multimodal cues. Goal Met 4. Demonstrate attention to right environment and body during functional tasks with Mod A verbal and question cues Goal Met 5. Demonstrate functional problem solving with basic and familiar tasks with Mod A visual and tactile cues. Goal Met  New Short Term Goals: 1. Pt will express basic wants needs with Min A with multimodal cueing.  2. Pt will follow basic 2-step direction with Min A visual cues  3. Pt will demonstrate emergent awareness of expressive deficits during functional communication task with Supervision multimodal cues.  4. Demonstrate attention to right environment and body during functional tasks with supervision verbal and question cues  5. Demonstrate functional problem solving with basic and familiar tasks with supervision visual and tactile cues.    Skilled Therapeutic Interventions: Treatment focus on functional communication. Pt continues to demonstrate decreased frustration tolerance and needs Mod A verbal cues to utilize multimodal communication (gestures) to increase expression and allow communication partner to try to assist. Pt with an increase in self-monitoring errors with both verbal and written expression but needs Max A phonemic and semantic cues for self-correcting. Pt  independently wrote name with RUE.   Progress Updates: Pt has made great progress and has met 5 out of 5 STG's this reporting period. Currently, pt is overall Min A for basic comprehension and Mod A for verbal expression and functional communication. [name] overall emergent awareness and ability to self-monitor verbal errors are improving but continues to need Mod semantic and phonemic cues to self-correct. [name] overall automatic/social communication has improved. Pt overall Min A for cognitive function with basic and familiar tasks but continues to demonstrate decreased problem solving and safety awareness. Pt/family education ongoing. Pt would benefit from continued SLP services to maximize overall functional communication and independence for d/c home.   Precautions/Restrictions  Restrictions Weight Bearing Restrictions: No  Pain Assessment Pain Assessment: No/denies pain  Oral/Motor: Oral Motor/Sensory Function Labial Strength: Reduced (on Right) Lingual ROM: Within Functional Limits Motor Speech Intelligibility: Intelligible Comprehension: Auditory Comprehension Yes/No Questions: Within Functional Limits Basic Biographical Questions: 76-100% accurate Basic Immediate Environment Questions: 75-100% accurate Commands: Impaired One Step Basic Commands: 75-100% accurate Two Step Basic Commands: 75-100% accurate Conversation: Simple Interfering Components: Motor planning EffectiveTechniques: Extra processing time;Visual/Gestural cues Visual Recognition/Discrimination Discrimination: Within Function Limits Reading Comprehension Reading Status: Impaired Word level: Within functional limits Sentence Level: Impaired Paragraph Level: Impaired Functional Environmental (signs, name badge): Within functional limits Interfering Components:  (aphasia) Expression: Expression Primary Mode of Expression: Verbal Verbal Expression Initiation: No impairment Automatic Speech: Name;Social  Response;Counting;Day of week Level of Generative/Spontaneous Verbalization: Phrase Repetition: Impaired Level of Impairment: Word level Naming: Impairment Responsive: 51-75% accurate Confrontation: Impaired Convergent: 50-74% accurate Divergent: Not tested Verbal Errors: Phonemic paraphasias;Aware of errors Pragmatics: No impairment Interfering Components:  (aphasia and apraxia) Effective Techniques: Semantic cues;Phonemic cues;Sentence completion Non-Verbal Means of Communication: Gestures Written Expression Dominant Hand: Right Written Expression: Exceptions to  WFL  Therapy/Group: Individual Therapy  Diora Bellizzi 06/19/2011 10:58 AM

## 2011-06-20 MED ORDER — SIMVASTATIN 20 MG PO TABS: 20.0000 mg | ORAL_TABLET | Freq: Every day | ORAL | Status: DC

## 2011-06-20 MED ORDER — METOPROLOL SUCCINATE ER 50 MG PO TB24: 50.0000 mg | ORAL_TABLET | Freq: Every day | ORAL | Status: DC

## 2011-06-20 NOTE — Progress Notes (Signed)
Physical Therapy Note  Patient Details  Name: Shyler Holzman MRN: [id number] Date of Birth: 1951/01/13 Today's Date: 06/20/2011  Time: 829-858 28 minutes  Gait training with mod I controlled and household environments.  Obstacle negotiation while holding ball with mod I.  Pt with occasional LOB, able to self correct.  Negotiating 2 flights of stairs with B handrails with supervision, cues for safety.  Floor transfers with supervision, cues for safety and technique.  Car transfers with mod I.  Pt with increased awareness with all activities.  Improved balance reactions with functional dynamic activities.  Individual Therapy   Cheyne Bungert 06/20/2011, 10:45 AM

## 2011-06-20 NOTE — Discharge Summary (Signed)
NAMEMarland Kitchen  Carl, Gilbert NO.:  [id number]  MEDICAL RECORD NO.:  [id number]  LOCATION:  4011                         FACILITY:  [facility]  PHYSICIAN:  Carl Gilbert, M.D.DATE OF BIRTH:  01/28/1951  DATE OF ADMISSION:  06/11/2011 DATE OF DISCHARGE:  06/21/2011                              DISCHARGE SUMMARY   DISCHARGE DIAGNOSES: 1. 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 2. Subcutaneous Lovenox for deep vein thrombosis prophylaxis. 3. Hypertension. 4. Tobacco abuse.  HISTORY:  This is a 60 year old right-handed black male, admitted November 21 with altered mental status, slurred speech.  Cranial CT scan was negative for acute changes.  The patient did not receive tPA.  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  CTA of the head showed a middle cerebral artery branch occlusion in the left brain.  Echocardiogram with ejection fraction 65% without wall motion abnormalities.  The patient was placed on aspirin therapy as well as subcutaneous Lovenox for deep vein thrombosis prophylaxis.  A TEE was pending.  Currently, the patient required moderate assist ambulate 10 feet.  He was admitted for comprehensive rehab program.  PAST MEDICAL HISTORY:  See discharge diagnoses.  ALLERGIES:  None.  SOCIAL HISTORY:  He lives with his spouse and assistance as needed.  FUNCTIONAL HISTORY PRIOR TO ADMISSION:  Independent.  FUNCTIONAL STATUS UPON ADMISSION TO REHAB SERVICES:  Minimal assist for sit to stand, ambulating total assist, 100 feet with multiple cues using a rolling walker.  He was requiring minimal assistance for activities of daily living.  PHYSICAL EXAMINATION:  VITAL SIGNS:  Blood pressure 141/82 pulse 74, temperature 97.8 respirations 18. GENERAL:  This was an alert male, well developed. HEENT:  Normocephalic. NEUROLOGIC:  Expressive language deficits.  He was showing receptive aphasia as well as  apraxia. LUNGS:  Clear to auscultation. CARDIAC:  Regular rate and rhythm. ABDOMEN:  Soft, nontender.  Good bowel sounds.  HOSPITAL COURSE:  The patient was admitted to inpatient rehab services with therapies initiated on a 3-hour daily basis consisting of physical therapy, occupational therapy, speech therapy, and rehabilitation nursing.  The following issues were addressed during the patient's rehabilitation stay.  Pertaining to [name]'s left middle cerebral artery infarction remained stable, maintained on aspirin therapy.  He continued on subcutaneous Lovenox throughout his rehab course for deep vein thrombosis prophylaxis with no bleeding episodes noted.  His blood pressures were well controlled and monitored on Toprol 50 mg daily with no orthostatic changes.  Arrangements were made for the patient to have a primary MD, of which he did not have 1 prior to hospital admission with [facility], Dr. Andrey Gilbert at [phone].  The patient did have a noted history of tobacco as well as marijuana use. He did receive full counseling in regards to cessation of these products.  It was questionable if he would be compliant with these requests.  He had no bowel or bladder disturbances throughout his rehab course.  The patient received weekly collaborative interdisciplinary team conferences to discuss estimated length of stay, family teaching, and any barriers to discharge.  Full family teaching was completed.  He is to be discharged to  home with family and supervision as needed.  He was overall minimal assistance due to his apraxia.  It was discussed no driving with the patient and family.  He did show improvement in his apraxia, fluency of his speech was much improved consisting communication skills, optimization of secondary prevention strategies were discussed.  He was discharged to home with ongoing therapies as advised.  DISCHARGE MEDICATIONS: 1. Aspirin 325 mg  daily. 2. Toprol-XL 50 mg daily. 3. Zocor 20 mg daily.  His diet was regular with no swallowing difficulties.  SPECIAL INSTRUCTIONS:  Continue therapies as advised per rehab services. The patient to follow up with Dr. Faith Gilbert at the outpatient rehab service office, appointment to be made.  Appointment scheduled with [facility] to ongoing primary care to be provided.  The patient was advised no driving, no smoking.  No alcohol. No illicit drugs.     Carl Gilbert, P.A.   ______________________________ Carl Gilbert, M.D.    DA/MEDQ  D:  06/20/2011  T:  06/20/2011  Job:  [id number]

## 2011-06-20 NOTE — Progress Notes (Signed)
Speech Language Pathology Therapy Note  Patient Details  Name: Carl Gilbert MRN: [id number] Date of Birth: 04-19-1951  Today's Date: 06/20/2011 Time: 0900-1000 Time Calculation (min): 60 min  Precautions: Precautions Precautions: Fall Precaution Comments: bed alarm, quick release belt Required Braces or Orthoses: No Restrictions Weight Bearing Restrictions: No  Skilled Therapeutic Interventions: Treatment focus on functional communication and oral reading/comprehension at the word/phrase level. Pt utilize multimodal communication with gestures and description with supervision verbal cues during a functional conversation about Christmas. Overall Min A phonemic and visual cues for oral reading at the word and phrase level in a magazine. Pt continues to demonstrate poor frustration tolerance which impacts ability to communicate. Min A verbal and question cues for functional problem solving to retrieve soda from the refrigerator. [name] caregiver will be coming in tomorrow for family education in regards to functional communication and cognition.    Oral/Motor: Oral Motor/Sensory Function Labial Strength: Reduced (on Right) Lingual ROM: Within Functional Limits Motor Speech Intelligibility: Intelligible Comprehension: Auditory Comprehension Yes/No Questions: Within Functional Limits Basic Biographical Questions: 76-100% accurate Basic Immediate Environment Questions: 75-100% accurate Commands: Impaired One Step Basic Commands: 75-100% accurate Two Step Basic Commands: 75-100% accurate Conversation: Simple Interfering Components: Motor planning EffectiveTechniques: Extra processing time;Visual/Gestural cues Visual Recognition/Discrimination Discrimination: Within Function Limits Reading Comprehension Reading Status: Impaired Word level: Within functional limits Sentence Level: Impaired Paragraph Level: Impaired Functional Environmental (signs, name badge): Within functional  limits Interfering Components:  (aphasia) Expression: Expression Primary Mode of Expression: Verbal Verbal Expression Initiation: No impairment Automatic Speech: Name;Social Response;Counting;Day of week Level of Generative/Spontaneous Verbalization: Phrase Repetition: Impaired Level of Impairment: Word level Naming: Impairment Responsive: 51-75% accurate Confrontation: Impaired Convergent: 50-74% accurate Divergent: Not tested Verbal Errors: Phonemic paraphasias;Aware of errors Pragmatics: No impairment Interfering Components:  (aphasia and apraxia) Effective Techniques: Semantic cues;Phonemic cues;Sentence completion Non-Verbal Means of Communication: Gestures Written Expression Dominant Hand: Right Written Expression: Exceptions to [facility]  Therapy/Group: Individual Therapy  Tarrie Mcmichen 06/20/2011 10:23 AM

## 2011-06-20 NOTE — Progress Notes (Signed)
Patient ID: Carl Gilbert, male   DOB: [birth year], 60 y.o.   MRN: [id number] Patient ID: Carl Gilbert, male   DOB: 02/24/1951, 60 y.o.   MRN: [id number] Patient ID: Carl Gilbert, male   DOB: [birth year], 59 y.o.   MRN: [id number] Patient ID: Carl Gilbert, male   DOB: 03-Feb-1951, 60 y.o.   MRN: [id number] Patient ID: Carl Gilbert, male   DOB: 26-Mar-1951, 60 y.o.   MRN: [id number] Subjective/Complaints: farily good night. Review of Systems  Neurological: Positive for sensory change and speech change.  All other systems reviewed and are negative.  Excited to go home    Objective: Vital Signs: Blood pressure 149/91, pulse 72, temperature 98.6 F (37 C), temperature source Oral, resp. rate 20, height 5\' 7"  (1.702 m), weight 76.386 kg (168 lb 6.4 oz), SpO2 99.00%. No results found. No results found for this basename: WBC:2,HGB:2,HCT:2,PLT:2 in the last 72 hours No results found for this basename: NA:2,K:2,CL:2,CO2:2,GLUCOSE:2,BUN:2,CREATININE:2,CALCIUM:2 in the last 72 hours CBG (last 3)  No results found for this basename: GLUCAP:3 in the last 72 hours  Wt Readings from Last 3 Encounters:  06/19/11 76.386 kg (168 lb 6.4 oz)  06/06/11 81.3 kg (179 lb 3.7 oz)  06/06/11 81.3 kg (179 lb 3.7 oz)    Physical Exam:  General appearance: alert, cooperative and no distress Head: Normocephalic, without obvious abnormality, atraumatic Eyes: conjunctivae/corneas clear. PERRL, EOM's intact. Fundi benign. Ears: normal TM's and external ear canals both ears Nose: Nares normal. Septum midline. Mucosa normal. No drainage or sinus tenderness. Throat: mucosa pink and moist.  dentition borderline/poor Neck: no adenopathy, no carotid bruit, no JVD, supple, symmetrical, trachea midline and thyroid not enlarged, symmetric, no tenderness/mass/nodules Back: symmetric, no curvature. ROM normal. No CVA tenderness. Resp: clear to auscultation bilaterally Cardio: regular rate and rhythm, S1, S2 normal,  no murmur, click, rub or gallop GI: soft, non-tender; bowel sounds normal; no masses,  no organomegaly Extremities: extremities normal, atraumatic, no cyanosis or edema Pulses: 2+ and symmetric Skin: Skin color, texture, turgor normal. No rashes or lesions Neurologic: patient with expressive aphasia and apraxia but improved.  More spontaneous with speech and movement. Right side with slight sensory loss.  Central 7,5,12 noted on right.  Strength grossly 4+/5 right upper extremity. 4+5 right lower extremity. Incision/Wound: n/a   Assessment/Plan: 1. Functional deficits secondary to left MCA infarct which require 3+ hours per day of interdisciplinary therapy in a comprehensive inpatient rehab setting. Physiatrist is providing close team supervision and 24 hour management of active medical problems listed below. Physiatrist and rehab team continue to assess barriers to discharge/monitor patient progress toward functional and medical goals.    Working toward d/c Friday.  mobility: Bed Mobility Supine to Sit: 5: Supervision Transfers Transfers: Yes Sit to Stand: 4: Min assist Stand to Sit: 4: Min assist Stand Pivot Transfers - DO NOT USE: 4: Min assist Ambulation/Gait Ambulation/Gait Assistance: 5: Supervision Ambulation/Gait Assistance Details (indicate cue type and reason): tactile ckues for relaxation of rt. arm to increase arm swing, VC for less rt. foot drag Ambulation Distance (Feet): 150 Feet (x 2) Assistive device: None Gait Pattern: Step-through pattern;Right steppage;Right foot flat Stairs: Yes Stairs Assistance: 4: Min assist Stairs Assistance Details (indicate cue type and reason): pt. varied between step to and alternating pattern on steps in community. Stair Management Technique: One rail Left Number of Stairs: 20  Wheelchair Mobility Wheelchair Mobility: No Wheelchair Assistance: 5: Investment banker, operational: Both lower extermities Distance: 150 ADL:     Cognition: Cognition  Overall Cognitive Status: Impaired Arousal/Alertness: Awake/alert Orientation Level: Oriented X4 Attention: Alternating;Divided Sustained Attention: Appears intact Sustained Attention Impairment: Functional basic;Verbal basic Selective Attention: Appears intact Selective Attention Impairment: Verbal basic;Functional basic Alternating Attention: Impaired Alternating Attention Impairment: Functional basic Divided Attention: Impaired Divided Attention Impairment: Functional basic Memory: Appears intact Memory Impairment: Decreased recall of new information Awareness: Impaired Awareness Impairment: Emergent impairment;Anticipatory impairment Problem Solving: Impaired Problem Solving Impairment: Functional complex Executive Function: Organizing;Self Monitoring;Self Correcting;Sequencing;Reasoning Reasoning: Impaired Reasoning Impairment: Functional complex Sequencing: Impaired Sequencing Impairment: Functional complex Organizing: Impaired Organizing Impairment: Functional complex Decision Making: Impaired Decision Making Impairment: Functional basic Initiating: Appears intact Self Monitoring: Appears intact Self Monitoring Impairment: Functional basic;Verbal basic Self Correcting: Impaired Self Correcting Impairment: Verbal basic;Functional complex Behaviors: Impulsive;Poor frustration tolerance Safety/Judgment: Impaired (up in room without assistance at times ) Comments: decreased insight and awareness, needs cues for selective attention Cognition Arousal/Alertness: Awake/alert Orientation Level: Oriented X4   Medical Problem List and Plan:  1.Left MCA infarct/aphasia.R hemiparesis Evaluat and treat.Continue aspirin.  1. DVT Prophylaxis/Anticoagulation: Lovenox.monitor platelet count and any bleeding episodes. Patient also ambulating quite a bit now  2. Pain Management: tylenol.No complaints of pain.   3.HTN.Toprol 25mg .  Will increase to 50mg   watching closely for bradycardia.  4.Tobacco abuse/marijuna use.Provide counseling and education.  Pt is better informed.  3. Mood: ego support    LOS (Days) 9  Anthonee Gelin T 06/20/2011, 8:26 AM

## 2011-06-20 NOTE — Progress Notes (Signed)
Therapeutic Recreation Discharge Summary Patient Details  Name: Juana Montini MRN: [id number] Date of Birth: [birth year]  Long term goals set: 1  Long term goals met: 1  Comments on progress toward goals: pt has made excellent progress toward goal meeting Min assist level for community pursuits on unlevel surfaces and supervision on level surfaces.  Pt demonstrated anticipatory awareness by identifying and avoiding obstacles encountered during community activity independently while requiring min/contact guard assist to avoid it.  Pt is mostly limited by expressive & receptive aphasia and decreased cognition requiring 24 hour supervision.  Pt scheduled for d/c home tomorrow.   Reasons goals not met: n/a  Equipment acquired: none  Reasons for discharge: discharge from hospital  Azhia Siefken 06/20/2011, 5:45 PM

## 2011-06-20 NOTE — Progress Notes (Signed)
Occupational Therapy Session Note and Weekly Progress Note  Patient Details  Name: Carl Gilbert MRN: [id number] Date of Birth: [birth year]  Today's Date: 06/20/2011 Time: 0715-0815 Time Calculation (min): 60 min  Weekly Progress Note Patient has met 4 of 4 short term goals and continues to make steady progress.  Pt is supervision for bathing, dressing, and shower transfers; mod I for toilet transfers and toileting.  Pt continues to require min verbal cues to attend to right when engaged in activities but has exhibited increased self-awareness with attending to right.  Patient continues to demonstrate the following deficits:muscle weakness, impaired timing and sequencing, abnormal tone, decreased coordination and decreased motor planning, decreased visual perceptual skills, decreased attention to right and decreased motor planning, decreased attention, decreased awareness, decreased problem solving, decreased safety awareness, decreased memory and delayed processing and decreased standing balance, decreased postural control and decreased balance strategies and therefore will continue to benefit from skilled OT intervention to enhance overall performance with ADL.  Patient progressing toward long term goals..  Continue plan of care. Family education with pt's fiance is planned for Friday (tomorrow) prior to D/C.  OT Short Term Goals OT Short Term Goal 1: Pt will don UB clothing with min A  once clothing orientated. OT Short Term Goal 1 - Progress: Met OT Short Term Goal 2: Pt will bathe 10/10 parts at shower level with min A with max cuing OT Short Term Goal 2 - Progress: Met OT Short Term Goal 3: Pt will don LB clothing with min A with max cuing OT Short Term Goal 3 - Progress: Met OT Short Term Goal 4: Pt will attend to right side with mod multimodal cues. OT Short Term Goal 4 - Progress: Met  Therapy Documentation Precautions: Precautions Precautions: Fall Precaution Comments: bed  alarm, quick release belt Required Braces or Orthoses: No Restrictions Weight Bearing Restrictions: No  Skilled Therapeutic Interventions/Progress Updates:    ADL retraining including bathing at shower level and dressing with sit to stand from toilet.  Pt ambulated in room to gather clothes and into BR for walk-in shower transfer.  Pt supervision for all bathing tasks with standing for bathing buttocks.  Pt supervision for dressing tasks.  Pt gathered soiled clothing to place in drawer with other clothing.  Pt engaged in dynamic standing activities incorporating eye-hand coordination activities.  Pt demo LOB X 1 but was able to self correct.  Pt occasionally drops items from right hand when engaged in activities but is aware and retrieves item. Focus on sequencing, attention to right, dynamic standing balance, and safety awareness.    Pain Pain Assessment Pain Assessment: No/denies pain Pain Score: 0-No pain ADL ADL Grooming: Supervision/safety Where Assessed-Grooming: Standing at sink Upper Body Bathing: Supervision/safety Where Assessed-Upper Body Bathing: Shower Lower Body Bathing: Supervision/safety Where Assessed-Lower Body Bathing: Shower Upper Body Dressing: Supervision/safety Where Assessed-Upper Body Dressing: Chair Lower Body Dressing: Supervision/safety Where Assessed-Lower Body Dressing: Chair Toileting: Moderate cueing;Moderate assistance Where Assessed-Toileting: Teacher, adult education: Engineer, agricultural Method: Proofreader: Raised Copy: Distant supervision Film/video editor Method: Designer, industrial/product: Information systems manager with back ADL Comments: Wife assisted with OT direction with showering     Other Treatments   PM session  No c/o pain- just fatigue 14:15-15:00 1:1 Therapeutic activity playing the Wii and playing tennis with rackets to focus on right hand awareness/  attention/ FMC/ hand eye coordination, gross motor coordinated movements, picking items off the floor to challenge  his balance, functional ambulation (pt with right leg drag and decreased coordination). Roney Mans MOTR/L  Therapy/Group: Individual Therapy  Rich Brave 06/20/2011, 11:16 AM

## 2011-06-21 NOTE — Progress Notes (Signed)
Patient ID: Carl Gilbert, male   DOB: [birth year], 60 y.o.   MRN: [id number] Patient ID: Carl Gilbert, male   DOB: 1950-11-24, 60 y.o.   MRN: [id number] Patient ID: Carl Gilbert, male   DOB: 07/09/1951, 60 y.o.   MRN: [id number] Patient ID: Carl Gilbert, male   DOB: 1950/12/13, 60 y.o.   MRN: [id number] Patient ID: Carl Gilbert, male   DOB: [birth year], 60 y.o.   MRN: [id number] Patient ID: Carl Gilbert, male   DOB: [birth year], 60 y.o.   MRN: [id number] Subjective/Complaints: farily good night. Review of Systems  Neurological: Positive for sensory change and speech change.  All other systems reviewed and are negative.   Packing up to go home!   Objective: Vital Signs: Blood pressure 159/75, pulse 62, temperature 98.8 F (37.1 C), temperature source Oral, resp. rate 20, height 5\' 7"  (1.702 m), weight 76.386 kg (168 lb 6.4 oz), SpO2 98.00%. No results found. No results found for this basename: WBC:2,HGB:2,HCT:2,PLT:2 in the last 72 hours No results found for this basename: NA:2,K:2,CL:2,CO2:2,GLUCOSE:2,BUN:2,CREATININE:2,CALCIUM:2 in the last 72 hours CBG (last 3)  No results found for this basename: GLUCAP:3 in the last 72 hours  Wt Readings from Last 3 Encounters:  06/19/11 76.386 kg (168 lb 6.4 oz)  06/06/11 81.3 kg (179 lb 3.7 oz)  06/06/11 81.3 kg (179 lb 3.7 oz)    Physical Exam:  General appearance: alert, cooperative and no distress Head: Normocephalic, without obvious abnormality, atraumatic Eyes: conjunctivae/corneas clear. PERRL, EOM's intact. Fundi benign. Ears: normal TM's and external ear canals both ears Nose: Nares normal. Septum midline. Mucosa normal. No drainage or sinus tenderness. Throat: mucosa pink and moist.  dentition borderline/poor Neck: no adenopathy, no carotid bruit, no JVD, supple, symmetrical, trachea midline and thyroid not enlarged, symmetric, no tenderness/mass/nodules Back: symmetric, no curvature. ROM normal. No CVA  tenderness. Resp: clear to auscultation bilaterally Cardio: regular rate and rhythm, S1, S2 normal, no murmur, click, rub or gallop GI: soft, non-tender; bowel sounds normal; no masses,  no organomegaly Extremities: extremities normal, atraumatic, no cyanosis or edema Pulses: 2+ and symmetric Skin: Skin color, texture, turgor normal. No rashes or lesions Neurologic: patient with expressive aphasia and apraxia but improved.  Speech is more fluent.  More spontaneous with speech and movement. Right side with slight sensory loss.  Central 7,5,12 noted on right but improved  Strength grossly 4+/5 right upper extremity. 4+5 right lower extremity. Incision/Wound: n/a   Assessment/Plan: 1. Functional deficits secondary to left MCA infarct which require 3+ hours per day of interdisciplinary therapy in a comprehensive inpatient rehab setting. Physiatrist is providing close team supervision and 24 hour management of active medical problems listed below. Physiatrist and rehab team continue to assess barriers to discharge/monitor patient progress toward functional and medical goals.    Family ed today prior to d/c mobility: Bed Mobility Supine to Sit: 5: Supervision Transfers Transfers: Yes Sit to Stand: 4: Min assist Stand to Sit: 4: Min assist Stand Pivot Transfers - DO NOT USE: 4: Min assist Ambulation/Gait Ambulation/Gait Assistance: 7: Independent Ambulation/Gait Assistance Details (indicate cue type and reason): tactile ckues for relaxation of rt. arm to increase arm swing, VC for less rt. foot drag Ambulation Distance (Feet): 150 Feet (x 2) Assistive device: None Gait Pattern: Step-through pattern;Right steppage;Right foot flat Stairs: Yes Stairs Assistance: 4: Min assist Stairs Assistance Details (indicate cue type and reason): pt. varied between step to and alternating pattern on steps in community. Stair Management Technique: One rail Left Number of Stairs: 20  Research officer, political party: No Wheelchair Assistance: 5: Investment banker, operational: Both lower extermities Distance: 150 ADL:   Cognition: Cognition Overall Cognitive Status: Impaired Arousal/Alertness: Awake/alert Orientation Level: Oriented X4 Attention: Alternating;Divided Sustained Attention: Appears intact Sustained Attention Impairment: Functional basic;Verbal basic Selective Attention: Appears intact Selective Attention Impairment: Verbal basic;Functional basic Alternating Attention: Impaired Alternating Attention Impairment: Functional basic Divided Attention: Impaired Divided Attention Impairment: Functional basic Memory: Appears intact Memory Impairment: Decreased recall of new information Awareness: Impaired Awareness Impairment: Emergent impairment;Anticipatory impairment Problem Solving: Impaired Problem Solving Impairment: Functional complex Executive Function: Organizing;Self Monitoring;Self Correcting;Sequencing;Reasoning Reasoning: Impaired Reasoning Impairment: Functional complex Sequencing: Impaired Sequencing Impairment: Functional complex Organizing: Impaired Organizing Impairment: Functional complex Decision Making: Impaired Decision Making Impairment: Functional basic Initiating: Appears intact Self Monitoring: Appears intact Self Monitoring Impairment: Functional basic;Verbal basic Self Correcting: Impaired Self Correcting Impairment: Verbal basic;Functional complex Behaviors: Impulsive;Poor frustration tolerance Safety/Judgment: Impaired (up in room without assistance at times ) Comments: decreased insight and awareness, needs cues for selective attention Cognition Arousal/Alertness: Awake/alert Orientation Level: Oriented X4   Medical Problem List and Plan:  1.Left MCA infarct/aphasia.R hemiparesis Evaluat and treat.Continue aspirin. Discussed secondary prevention with patient.   See me in about 4 weeks.  1. DVT  Prophylaxis/Anticoagulation: Lovenox.monitor platelet count and any bleeding episodes. Patient also ambulating quite a bit now  2. Pain Management: tylenol.No complaints of pain.   3.HTN.Toprol 50mg  with improvement.  Will need monitoring of this at home.  4.Tobacco abuse/marijuna use.Provide counseling and education.  Pt is better informed.  3. Mood: ego support    LOS (Days) 10  Mayli Covington T 06/21/2011, 7:00 AM

## 2011-06-21 NOTE — Progress Notes (Signed)
Occupational Therapy Discharge Summary   Patient Details  Name: Carl Gilbert MRN: [id number] Date of Birth: [birth year] Today's Date: 06/21/2011  Patient has met 9 of 9 long term goals due to improved balance, ability to compensate for deficits, functional use of  RIGHT upper extremity and improved attention.  Patient's care partner is independent to provide the necessary physical and cognitive assistance at discharge.    Recommendation:  Patient will continue to receive OT services in a home health setting to continue to advance functional skills in the area of iADL.  Equipment: No equipment provided  Patient/family agrees with progress made and goals achieved: Yes  Carl Gilbert 06/21/2011, 11:21 AM

## 2011-06-21 NOTE — Progress Notes (Signed)
Physical Therapy Discharge Summary  Patient Details  Name: Carl Gilbert MRN: [id number] Date of Birth: [birth year] Today's Date: 06/21/2011 9:30- 9:40  No pain, individual therapy.  Unable to repeat BERG because pt. Was ready to discharge and declined. Performed gait trial with SPC and pt. Returned demonstration safely at [employer]. I level  Patient has met 7 of 7 long term goals due to improved balance.  Patient to discharge at an ambulatory level Modified Independent.   Patient's care partner is independent to provide the necessary cognitive assistance at discharge. Pt. Initially required mod assist for poor balance on evaluation, and will dc home at mod. I level with gait without AD in the home and a cane in the community.  Pt requires supervision at home due to apraxia and difficulty organizing functional taSK.  Recommendation:  Patient will benefit from ongoing skilled PT services in home health setting to continue to advance safe functional mobility, address ongoing impairments in balance, strength, gait, and minimize fall risk.  Equipment: Equipment provided: cane  Patient/family agrees with progress made and goals achieved: Yes  Julian Reil 06/21/2011, 9:17 AM

## 2011-06-21 NOTE — Progress Notes (Signed)
Social Work Discharge Note Discharge Note  The overall goal for the admission was met for:   Discharge location: Yes - home with girlfriend and family providing 24 hour supervision  Length of Stay: Yes - 10 days  Discharge activity level: Yes - supervision  Home/community participation: Yes  Services provided included: MD, RD, PT, OT, SLP, RN, CM, TR, Pharmacy and SW  Financial Services: Other: Medicaid app pending  Follow-up services arranged: Home Health: PT, OT, ST via [facility], DME: cane via [facility] and Patient/Family has no preference for HH/DME agencies  Comments (or additional information):  Patient/Family verbalized understanding of follow-up arrangements: Yes  Individual responsible for coordination of the follow-up plan: pt and girlfriend  Confirmed correct DME delivered: Ladarrious Kirksey 06/21/2011    Jules Baty

## 2011-06-21 NOTE — Progress Notes (Signed)
Discharge Summary  Speech Language Pathology Discharge Summary &Therapy Note      Patient Details  Name: Carl Gilbert MRN: [id number] Date of Birth: 1951/03/18  Today's Date: 06/21/2011 Time: 0835-0900 Time Calculation (min): 25 min  Precautions: Precautions Precautions: Fall Precaution Comments: bed alarm, quick release belt Required Braces or Orthoses: No Restrictions Weight Bearing Restrictions: No  New Short Term Goals:  1. Pt will express basic wants needs with Min A with multimodal cueing.  2. Pt will follow basic 2-step direction with Min A visual cues  3. Pt will demonstrate emergent awareness of expressive deficits during functional communication task with Supervision multimodal cues.  4. Demonstrate attention to right environment and body during functional tasks with supervision verbal and question cues  5. Demonstrate functional problem solving with basic and familiar tasks with supervision visual and tactile cues.   Skilled Therapeutic Interventions/Progress Updates: Session focused on demonstrating effective functional communication strategies with girlfriend/care giver present.  Patient required moderate assist semantic, phonemic, and visual cues to express basic information and girlfriend observed and verbalized understanding of techniques. Additionally, patient required supervision semantic cues to attend to right upper extremity within tasks and educated caregiver on rationale.   Precautions/Restrictions  Restrictions Weight Bearing Restrictions: No  Pain Pain Assessment Pain Assessment: No/denies pain Pain Score: 0-No pain  Oral/Motor: Oral Motor/Sensory Function Labial Strength: Reduced (on Right) Lingual ROM: Within Functional Limits Motor Speech Intelligibility: Intelligible Comprehension: Auditory Comprehension Yes/No Questions: Within Functional Limits Basic Biographical Questions: 76-100% accurate Basic Immediate Environment Questions: 75-100%  accurate Commands: Impaired One Step Basic Commands: 75-100% accurate Two Step Basic Commands: 75-100% accurate Conversation: Simple Interfering Components: Motor planning EffectiveTechniques: Extra processing time;Visual/Gestural cues Visual Recognition/Discrimination Discrimination: Within Function Limits Reading Comprehension Reading Status: Impaired Word level: Within functional limits Sentence Level: Impaired Paragraph Level: Impaired Functional Environmental (signs, name badge): Within functional limits Interfering Components:  (aphasia) Expression: Expression Primary Mode of Expression: Verbal Verbal Expression Initiation: No impairment Automatic Speech: Name;Social Response;Counting;Day of week Level of Generative/Spontaneous Verbalization: Phrase Repetition: Impaired Level of Impairment: Word level Naming: Impairment Responsive: 51-75% accurate Confrontation: Impaired Convergent: 50-74% accurate Divergent: Not tested Verbal Errors: Phonemic paraphasias;Aware of errors Pragmatics: No impairment Interfering Components:  (aphasia and apraxia) Effective Techniques: Semantic cues;Phonemic cues;Sentence completion Non-Verbal Means of Communication: Gestures Written Expression Dominant Hand: Right Written Expression: Exceptions to [facility]  Therapy/Group: Individual Therapy  Carl Gilbert 06/21/2011 9:14 AM  Long term goals set: 5  Long term goals met: 4  Comments on progress toward goals: Patient has progressed from a max assist communication level to a moderate (close to minimal assist) with verbal expression; minimal assist with comprehension of basic information.  Additionally, patient has made progress in the areas of utilizing compensatory strategies such as description and gestures to communicate wants and needs during moments of communication break down with minimal assist semantic cues.  And awareness/attention to right upper extremity during functional  tasks.  Reasons goals not met: Severity of aphasia and impact of apraxia on verbal expression  Equipment acquired: none  Reasons for discharge: discharge from hospital  Follow-up: Home Health  Patient/family agrees with progress made and goals achieved: Yes  Carl Gilbert, M.A., CCC-SLP [phone] Carl Gilbert 06/21/2011

## 2011-06-21 NOTE — Progress Notes (Signed)
Occupational Therapy Session Note  Patient Details  Name: Carl Gilbert MRN: [id number] Date of Birth: Dec 08, 1950  Today's Date: 06/21/2011 Time: 0905-0920 Time Calculation (min): 15 min  Precautions: Precautions Precautions: Fall Precaution Comments: bed alarm, quick release belt Required Braces or Orthoses: No Restrictions Weight Bearing Restrictions: No  Short Term Goals: OT Short Term Goal 1: Pt will don UB clothing with min A  once clothing orientated. OT Short Term Goal 1 - Progress: Met OT Short Term Goal 2: Pt will bathe 10/10 parts at shower level with min A with max cuing OT Short Term Goal 2 - Progress: Met OT Short Term Goal 3: Pt will don LB clothing with min A with max cuing OT Short Term Goal 3 - Progress: Met OT Short Term Goal 4: Pt will attend to right side with mod multimodal cues. OT Short Term Goal 4 - Progress: Met  Skilled Therapeutic Interventions/Progress Updates: Pt and fiancee seen for family education to focus on home safety with pt's decreased attention to right side and general safety awareness.  Steffanie Rainwater stated that she will have friends and family members stay with him while she is at work.  Pt and fiancee are comfortable with patient's status and ready for discharge.       Pain Pain Assessment Pain Assessment: No/denies pain Pain Score: 0-No pain ADL ADL Grooming: Modified independent Where Assessed-Grooming: Standing at sink Upper Body Bathing: Modified independent Where Assessed-Upper Body Bathing: Shower Lower Body Bathing: Modified independent Where Assessed-Lower Body Bathing: Shower Upper Body Dressing: Modified independent P Where Assessed-Upper Body Dressing: Chair Lower Body Dressing: Modified independent Where Assessed-Lower Body Dressing: Chair Toileting: Modified independent Where Assessed-Toileting: Teacher, adult education: Engineer, agricultural Method: Event organiser: Distant  supervision Film/video editor Method: Designer, industrial/product: Shower seat with back           [name] 06/21/2011, 11:23 AM

## 2011-07-30 ENCOUNTER — Encounter: Payer: Self-pay | Admitting: Physical Medicine & Rehabilitation

## 2011-07-30 ENCOUNTER — Encounter: Payer: Medicaid Other | Attending: Physical Medicine & Rehabilitation | Admitting: Physical Medicine & Rehabilitation

## 2011-07-30 DIAGNOSIS — I10 Essential (primary) hypertension: Secondary | ICD-10-CM | POA: Insufficient documentation

## 2011-07-30 DIAGNOSIS — I6330 Cerebral infarction due to thrombosis of unspecified cerebral artery: Secondary | ICD-10-CM

## 2011-07-30 DIAGNOSIS — R488 Other symbolic dysfunctions: Secondary | ICD-10-CM | POA: Insufficient documentation

## 2011-07-30 DIAGNOSIS — I6350 Cerebral infarction due to unspecified occlusion or stenosis of unspecified cerebral artery: Secondary | ICD-10-CM | POA: Insufficient documentation

## 2011-07-30 DIAGNOSIS — R4701 Aphasia: Secondary | ICD-10-CM | POA: Insufficient documentation

## 2011-07-30 NOTE — Assessment & Plan Note (Signed)
Carl Gilbert is back regarding his left MCA stroke.  He has been doing fairly well.  He receives some home therapies and outpatient therapies I believe.  He has been discharged from speech.  He is going to move temporarily with his sisters to the [place] area.  He states that he has some pain in the right leg, but on further questioning, he seems to be more some tingling and numbness that he experiences.  He is sleeping fairly well.  He uses a cane for balance.  REVIEW OF SYSTEMS:  Notable for the above.  Full 12-point review is in the written health and history section of the chart.  SOCIAL HISTORY:  Unchanged.  He is single, living with his fiancee.  PHYSICAL EXAMINATION:  VITAL SIGNS:  Blood pressure is 123/80, pulse 76, respiratory rate is 16 and he is satting 99% on room air. GENERAL:  The patient is pleasant, alert and appropriate. EXTREMITIES:  He moves all fours.  He does have a right pronator drift and diminished fine motor coordination of the right arm and leg.  He has diminished sensation at 1+/2 in both the arm and leg today.  When he ambulates he tends to favor the side and does not move it as fluidally as his left. NEUROLOGIC:  He has a verbal apraxia and word-finding deficits.  Given choices, he can often repeat the choice and was able to identify a few objects after extra time and cuing.  Cranial nerve exam is notable for mild right central 7th and tongue deviation, but speech is fairly clear. He is to follow simple commands without hesitation and is really very alert. HEART:  Regular. CHEST:  Clear. ABDOMEN:  Soft, nontender.  ASSESSMENT: 1. 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 Hypertension.  PLAN: 1. The patient would benefit from further outpatient speech therapy.     I gave the patient a prescription for this, although I am not sure     when it will be available to him in the [place] area given the fact     that he has no financial  resources for therapy. 2. I did refill his Toprol-XL 50 mg daily #30 and simvastatin 20 mg     daily #30 3. I will see the patient back in 2 months when he returns to town.     The patient's family will call me with any problems or questions.     Ranelle Oyster, M.D. Electronically Signed    ZTS/MedQ D:  07/30/2011 13:10:23  T:  07/30/2011 20:47:05  Job #:  [id number]

## 2011-09-04 ENCOUNTER — Encounter: Payer: Self-pay | Admitting: Physical Medicine & Rehabilitation

## 2011-09-04 ENCOUNTER — Encounter: Payer: Medicaid Other | Attending: Physical Medicine & Rehabilitation | Admitting: Physical Medicine & Rehabilitation

## 2011-09-04 DIAGNOSIS — I639 Cerebral infarction, unspecified: Secondary | ICD-10-CM

## 2011-09-04 DIAGNOSIS — M25569 Pain in unspecified knee: Secondary | ICD-10-CM

## 2011-09-04 DIAGNOSIS — I10 Essential (primary) hypertension: Secondary | ICD-10-CM | POA: Insufficient documentation

## 2011-09-04 DIAGNOSIS — I6350 Cerebral infarction due to unspecified occlusion or stenosis of unspecified cerebral artery: Secondary | ICD-10-CM | POA: Insufficient documentation

## 2011-09-04 MED ORDER — METOPROLOL SUCCINATE ER 50 MG PO TB24: 100.0000 mg | ORAL_TABLET | Freq: Every day | ORAL | Status: DC

## 2011-09-04 NOTE — Patient Instructions (Signed)
Ice to left or right knee if you have pain.  You also use tylenol over the counter
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
## 2011-09-19 DIAGNOSIS — I69359 Hemiplegia and hemiparesis following cerebral infarction affecting unspecified side: Secondary | ICD-10-CM | POA: Insufficient documentation

## 2011-09-19 DIAGNOSIS — E785 Hyperlipidemia, unspecified: Secondary | ICD-10-CM

## 2011-09-23 ENCOUNTER — Ambulatory Visit: Payer: Self-pay | Admitting: Physical Medicine & Rehabilitation

## 2011-11-12 ENCOUNTER — Other Ambulatory Visit: Payer: Self-pay | Admitting: Physical Medicine & Rehabilitation

## 2011-11-14 ENCOUNTER — Telehealth: Payer: Self-pay | Admitting: Physical Medicine & Rehabilitation

## 2011-11-14 DIAGNOSIS — I10 Essential (primary) hypertension: Secondary | ICD-10-CM

## 2011-11-14 MED ORDER — METOPROLOL SUCCINATE ER 50 MG PO TB24: 100.0000 mg | ORAL_TABLET | Freq: Every day | ORAL | Status: DC

## 2011-11-14 NOTE — Telephone Encounter (Signed)
Can Dr prescribe enough to get him thru to appt on 11/27/11?

## 2011-11-14 NOTE — Telephone Encounter (Signed)
Needs refill on Metoprolol

## 2011-11-14 NOTE — Telephone Encounter (Signed)
LM letting them know that his PCP will need to be contacted for a refill of medication.

## 2011-11-14 NOTE — Telephone Encounter (Signed)
Rx sent in, [facility] aware.

## 2011-11-27 ENCOUNTER — Encounter: Payer: Medicaid Other | Attending: Physical Medicine & Rehabilitation | Admitting: Physical Medicine & Rehabilitation

## 2011-11-27 ENCOUNTER — Encounter: Payer: Self-pay | Admitting: Physical Medicine & Rehabilitation

## 2011-11-27 VITALS — BP 163/87 | HR 64 | Ht 71.0 in | Wt 176.0 lb

## 2011-11-27 DIAGNOSIS — M25569 Pain in unspecified knee: Secondary | ICD-10-CM

## 2011-11-27 DIAGNOSIS — I639 Cerebral infarction, unspecified: Secondary | ICD-10-CM

## 2011-11-27 DIAGNOSIS — I10 Essential (primary) hypertension: Secondary | ICD-10-CM

## 2011-11-27 DIAGNOSIS — I6350 Cerebral infarction due to unspecified occlusion or stenosis of unspecified cerebral artery: Secondary | ICD-10-CM | POA: Insufficient documentation

## 2011-11-27 MED ORDER — METOPROLOL SUCCINATE ER 50 MG PO TB24: 100.0000 mg | ORAL_TABLET | Freq: Every day | ORAL | Status: DC

## 2011-11-27 MED ORDER — LISINOPRIL 20 MG PO TABS: 20.0000 mg | ORAL_TABLET | Freq: Every day | ORAL | Status: DC

## 2011-11-27 MED ORDER — SIMVASTATIN 20 MG PO TABS: 20.0000 mg | ORAL_TABLET | Freq: Every day | ORAL | Status: DC
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
## 2011-11-27 NOTE — Patient Instructions (Signed)
You need to find a family physician!  Wait for speech therapy to contact you about therapy

## 2011-12-27 ENCOUNTER — Telehealth: Payer: Self-pay | Admitting: Physical Medicine & Rehabilitation

## 2011-12-27 NOTE — Telephone Encounter (Signed)
Patient is out of all of his meds.  Can Dr please refill all of his meds?

## 2011-12-27 NOTE — Telephone Encounter (Signed)
Called pharmacy and they had additional refills on file. They are going to get them ready for him.

## 2011-12-31 ENCOUNTER — Ambulatory Visit: Payer: Medicaid Other | Attending: Physical Medicine & Rehabilitation

## 2011-12-31 DIAGNOSIS — R4701 Aphasia: Secondary | ICD-10-CM | POA: Insufficient documentation

## 2011-12-31 DIAGNOSIS — R488 Other symbolic dysfunctions: Secondary | ICD-10-CM | POA: Insufficient documentation

## 2011-12-31 DIAGNOSIS — IMO0001 Reserved for inherently not codable concepts without codable children: Secondary | ICD-10-CM | POA: Insufficient documentation

## 2012-01-21 ENCOUNTER — Ambulatory Visit: Payer: Medicaid Other | Attending: Physical Medicine & Rehabilitation

## 2012-01-21 DIAGNOSIS — R488 Other symbolic dysfunctions: Secondary | ICD-10-CM | POA: Insufficient documentation

## 2012-01-21 DIAGNOSIS — IMO0001 Reserved for inherently not codable concepts without codable children: Secondary | ICD-10-CM | POA: Insufficient documentation

## 2012-01-21 DIAGNOSIS — R4701 Aphasia: Secondary | ICD-10-CM | POA: Insufficient documentation

## 2012-02-04 ENCOUNTER — Ambulatory Visit: Payer: Medicaid Other

## 2012-02-18 ENCOUNTER — Ambulatory Visit: Payer: Medicaid Other | Attending: Physical Medicine & Rehabilitation

## 2012-02-18 DIAGNOSIS — R488 Other symbolic dysfunctions: Secondary | ICD-10-CM | POA: Insufficient documentation

## 2012-02-18 DIAGNOSIS — R4701 Aphasia: Secondary | ICD-10-CM | POA: Insufficient documentation

## 2012-02-18 DIAGNOSIS — IMO0001 Reserved for inherently not codable concepts without codable children: Secondary | ICD-10-CM | POA: Insufficient documentation

## 2012-04-24 ENCOUNTER — Other Ambulatory Visit: Payer: Self-pay | Admitting: Physical Medicine & Rehabilitation

## 2012-05-14 ENCOUNTER — Emergency Department (HOSPITAL_COMMUNITY)
Admission: EM | Admit: 2012-05-14 | Discharge: 2012-05-14 | Disposition: A | Discharge status: Home / Self Care | Payer: Medicaid Other | Attending: Emergency Medicine | Admitting: Emergency Medicine

## 2012-05-14 ENCOUNTER — Encounter (HOSPITAL_COMMUNITY): Payer: Self-pay | Admitting: Emergency Medicine

## 2012-05-14 DIAGNOSIS — E785 Hyperlipidemia, unspecified: Secondary | ICD-10-CM | POA: Insufficient documentation

## 2012-05-14 DIAGNOSIS — Z79899 Other long term (current) drug therapy: Secondary | ICD-10-CM | POA: Insufficient documentation

## 2012-05-14 DIAGNOSIS — I10 Essential (primary) hypertension: Secondary | ICD-10-CM | POA: Insufficient documentation

## 2012-05-14 DIAGNOSIS — F17200 Nicotine dependence, unspecified, uncomplicated: Secondary | ICD-10-CM | POA: Insufficient documentation

## 2012-05-14 DIAGNOSIS — T783XXA Angioneurotic edema, initial encounter: Secondary | ICD-10-CM | POA: Insufficient documentation

## 2012-05-14 DIAGNOSIS — Z8673 Personal history of transient ischemic attack (TIA), and cerebral infarction without residual deficits: Secondary | ICD-10-CM | POA: Insufficient documentation

## 2012-05-14 DIAGNOSIS — Z7982 Long term (current) use of aspirin: Secondary | ICD-10-CM | POA: Insufficient documentation

## 2012-05-14 MED ORDER — METHYLPREDNISOLONE SODIUM SUCC 125 MG IJ SOLR
125.0000 mg | Freq: Once | INTRAMUSCULAR | Status: AC
  Administered 2012-05-14: 125 mg via INTRAVENOUS
  Filled 2012-05-14: qty 2

## 2012-05-14 MED ORDER — FAMOTIDINE IN NACL 20-0.9 MG/50ML-% IV SOLN
20.0000 mg | Freq: Once | INTRAVENOUS | Status: AC
  Administered 2012-05-14: 20 mg via INTRAVENOUS
  Filled 2012-05-14: qty 50

## 2012-05-14 MED ORDER — PREDNISONE 10 MG PO TABS: 50.0000 mg | ORAL_TABLET | Freq: Every day | ORAL | Status: AC

## 2012-05-14 MED ORDER — DIPHENHYDRAMINE HCL 50 MG/ML IJ SOLN
25.0000 mg | Freq: Once | INTRAMUSCULAR | Status: AC
  Administered 2012-05-14: 25 mg via INTRAVENOUS
  Filled 2012-05-14: qty 1

## 2012-05-14 NOTE — ED Notes (Signed)
Pt here with lower lip swelling starting today; pt denies any pain or dental issue; pt is in lisinopril; no rest distress noted or SOB

## 2012-05-14 NOTE — ED Notes (Signed)
Patient resting while sitting in recliner. Patient denies any difficulty breathing or swallowing. Patient states his lips and jaws started swelling at approx 1300 to day. Patient denies any pain at this time. Patient's wife sitting on bed while patient remains in the recliner. Patient appears to be comfortable and states the swelling is not as bad as earlier today.

## 2012-05-14 NOTE — ED Provider Notes (Signed)
Patient moved to CDU for monitoring response to treatment of angioedema of lower lip (likely d/t ACEI-lisinopril).  Swelling of lower lip persists but has improved.  No respiratory distress.  Lungs CTA bilaterally.  Patient discharged home with short course of prednisone.  Patient to stop lisinopril and  follow-up with his PCP for change in hypertensive medication.  Jimmye Norman, NP 05/14/12 2111

## 2012-05-14 NOTE — ED Notes (Signed)
Pt has mild swelling to his lip and jaws/ pt denies pain, sob, difficulty swallowing.
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
## 2012-05-14 NOTE — ED Notes (Signed)
Patient has mouth swelling that started around 2p this afternoon. Has been taking lisinopril for the past year. This has not happened before. NAD. No breathing problems. No tongue swelling. No drooling. No tooth/gum pain.

## 2012-05-15 NOTE — ED Provider Notes (Signed)
Medical screening examination/treatment/procedure(s) were conducted as a shared visit with non-physician practitioner(s) and myself.  I personally evaluated the patient during the encounter   Joya Gaskins, MD 05/15/12 1627

## 2012-05-23 ENCOUNTER — Other Ambulatory Visit: Payer: Self-pay | Admitting: Physical Medicine & Rehabilitation

## 2012-10-06 ENCOUNTER — Ambulatory Visit (INDEPENDENT_AMBULATORY_CARE_PROVIDER_SITE_OTHER): Payer: Self-pay | Admitting: Neurology

## 2012-10-06 VITALS — BP 153/97 | HR 74

## 2012-10-06 DIAGNOSIS — Z0289 Encounter for other administrative examinations: Secondary | ICD-10-CM

## 2012-10-06 DIAGNOSIS — I639 Cerebral infarction, unspecified: Secondary | ICD-10-CM

## 2012-10-06 NOTE — Progress Notes (Signed)
Patient was seen today for his 12 month follow up visit on the DIAS-4 Research study.  Patient reports that he is doing well. He states no changes in his conmeds or medical history.  Patient completed questionnaires per trial requirements.  Dr. Pearlean Brownie talked with the patient while he was here to answer any questions or concerns.  Labs were drawn for antibody testing per protocol.

## 2012-12-03 ENCOUNTER — Ambulatory Visit (INDEPENDENT_AMBULATORY_CARE_PROVIDER_SITE_OTHER): Payer: Self-pay | Admitting: *Deleted

## 2012-12-03 DIAGNOSIS — I639 Cerebral infarction, unspecified: Secondary | ICD-10-CM

## 2012-12-03 DIAGNOSIS — Z0289 Encounter for other administrative examinations: Secondary | ICD-10-CM

## 2012-12-08 NOTE — Progress Notes (Signed)
Patient was seen for the 18 month follow up in the DIAS 4 trial. No reports of AE's or SAE's during visit.

## 2013-02-01 ENCOUNTER — Emergency Department (HOSPITAL_COMMUNITY): Payer: Medicaid Other

## 2013-02-01 ENCOUNTER — Emergency Department (HOSPITAL_COMMUNITY)
Admission: EM | Admit: 2013-02-01 | Discharge: 2013-02-01 | Disposition: A | Discharge status: Home / Self Care | Payer: Medicaid Other | Attending: Emergency Medicine | Admitting: Emergency Medicine

## 2013-02-01 ENCOUNTER — Encounter (HOSPITAL_COMMUNITY): Payer: Self-pay | Admitting: Emergency Medicine

## 2013-02-01 DIAGNOSIS — F17200 Nicotine dependence, unspecified, uncomplicated: Secondary | ICD-10-CM | POA: Insufficient documentation

## 2013-02-01 DIAGNOSIS — R0989 Other specified symptoms and signs involving the circulatory and respiratory systems: Secondary | ICD-10-CM | POA: Insufficient documentation

## 2013-02-01 DIAGNOSIS — R109 Unspecified abdominal pain: Secondary | ICD-10-CM | POA: Insufficient documentation

## 2013-02-01 DIAGNOSIS — I10 Essential (primary) hypertension: Secondary | ICD-10-CM | POA: Insufficient documentation

## 2013-02-01 DIAGNOSIS — E785 Hyperlipidemia, unspecified: Secondary | ICD-10-CM | POA: Insufficient documentation

## 2013-02-01 DIAGNOSIS — Z8673 Personal history of transient ischemic attack (TIA), and cerebral infarction without residual deficits: Secondary | ICD-10-CM | POA: Insufficient documentation

## 2013-02-01 DIAGNOSIS — R0609 Other forms of dyspnea: Secondary | ICD-10-CM | POA: Insufficient documentation

## 2013-02-01 DIAGNOSIS — Z79899 Other long term (current) drug therapy: Secondary | ICD-10-CM | POA: Insufficient documentation

## 2013-02-01 DIAGNOSIS — Z7982 Long term (current) use of aspirin: Secondary | ICD-10-CM | POA: Insufficient documentation

## 2013-02-01 LAB — CBC
HCT: 44.5 % (ref 39.0–52.0)
MCH: 28.4 pg (ref 26.0–34.0)
MCHC: 33.7 g/dL (ref 30.0–36.0)
MCV: 84.3 fL (ref 78.0–100.0)
Platelets: 194 10*3/uL (ref 150–400)
RDW: 13.9 % (ref 11.5–15.5)

## 2013-02-01 LAB — HEPATIC FUNCTION PANEL
ALT: 15 U/L (ref 0–53)
Alkaline Phosphatase: 96 U/L (ref 39–117)
Total Bilirubin: 0.5 mg/dL (ref 0.3–1.2)
Total Protein: 7.6 g/dL (ref 6.0–8.3)

## 2013-02-01 LAB — URINALYSIS, ROUTINE W REFLEX MICROSCOPIC
Bilirubin Urine: NEGATIVE
Hgb urine dipstick: NEGATIVE
Ketones, ur: NEGATIVE mg/dL
Specific Gravity, Urine: 1.02 (ref 1.005–1.030)
Urobilinogen, UA: 1 mg/dL (ref 0.0–1.0)

## 2013-02-01 LAB — BASIC METABOLIC PANEL
BUN: 35 mg/dL — ABNORMAL HIGH (ref 6–23)
Calcium: 9.9 mg/dL (ref 8.4–10.5)
Chloride: 99 mEq/L (ref 96–112)
Creatinine, Ser: 1.49 mg/dL — ABNORMAL HIGH (ref 0.50–1.35)
GFR calc Af Amer: 56 mL/min — ABNORMAL LOW (ref >90)
GFR calc non Af Amer: 49 mL/min — ABNORMAL LOW (ref >90)

## 2013-02-01 LAB — PRO B NATRIURETIC PEPTIDE: Pro B Natriuretic peptide (BNP): <5 pg/mL (ref 0–125)
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
## 2013-02-01 NOTE — ED Notes (Signed)
Pt c/o SOB x 2 days with abd pain and decreased appetite; pt denies swelling or CP

## 2013-02-01 NOTE — ED Notes (Addendum)
Pt c/o SOB only with exertion, sts it started about 2 days ago. Pt denies CP/n/v/diaphoresis along with the sob. sts he doesn't use an inhaler or anything at home. Denies cough. Pt in nad, skin warm and dry, resp e/u.

## 2013-02-12 ENCOUNTER — Other Ambulatory Visit: Payer: Self-pay | Admitting: Neurology

## 2013-02-16 ENCOUNTER — Telehealth: Payer: Self-pay

## 2013-02-16 NOTE — Telephone Encounter (Signed)
PCP to address Lopressor refill

## 2013-02-16 NOTE — Telephone Encounter (Signed)
The pharmacy contacted [place] saying the patient would like [place] to fill Rx for Metoprolol (Lopressor) 50 MG tablet Take two tablets po daily.  It doesn't appear we have prescribed this in the past.  Would you like to fill, or should patient request from PCP?  Please advise.  Thank you.

## 2013-04-05 ENCOUNTER — Emergency Department (HOSPITAL_COMMUNITY)
Admission: EM | Admit: 2013-04-05 | Discharge: 2013-04-05 | Disposition: A | Discharge status: Home / Self Care | Payer: Medicaid Other | Attending: Emergency Medicine | Admitting: Emergency Medicine

## 2013-04-05 ENCOUNTER — Encounter (HOSPITAL_COMMUNITY): Payer: Self-pay | Admitting: Emergency Medicine

## 2013-04-05 DIAGNOSIS — Z79899 Other long term (current) drug therapy: Secondary | ICD-10-CM | POA: Insufficient documentation

## 2013-04-05 DIAGNOSIS — Z8673 Personal history of transient ischemic attack (TIA), and cerebral infarction without residual deficits: Secondary | ICD-10-CM | POA: Insufficient documentation

## 2013-04-05 DIAGNOSIS — F17200 Nicotine dependence, unspecified, uncomplicated: Secondary | ICD-10-CM | POA: Insufficient documentation

## 2013-04-05 DIAGNOSIS — I10 Essential (primary) hypertension: Secondary | ICD-10-CM | POA: Insufficient documentation

## 2013-04-05 DIAGNOSIS — Z7982 Long term (current) use of aspirin: Secondary | ICD-10-CM | POA: Insufficient documentation

## 2013-04-05 DIAGNOSIS — T783XXA Angioneurotic edema, initial encounter: Secondary | ICD-10-CM

## 2013-04-05 DIAGNOSIS — E785 Hyperlipidemia, unspecified: Secondary | ICD-10-CM | POA: Insufficient documentation

## 2013-04-05 DIAGNOSIS — T465X5A Adverse effect of other antihypertensive drugs, initial encounter: Secondary | ICD-10-CM | POA: Insufficient documentation

## 2013-04-05 LAB — BASIC METABOLIC PANEL
BUN: 16 mg/dL (ref 6–23)
Chloride: 104 mEq/L (ref 96–112)
Creatinine, Ser: 1.19 mg/dL (ref 0.50–1.35)
GFR calc non Af Amer: 64 mL/min — ABNORMAL LOW (ref >90)
Glucose, Bld: 74 mg/dL (ref 70–99)
Potassium: 4.5 mEq/L (ref 3.5–5.1)

## 2013-04-05 MED ORDER — DIPHENHYDRAMINE HCL 50 MG/ML IJ SOLN
25.0000 mg | Freq: Once | INTRAMUSCULAR | Status: AC
  Administered 2013-04-05: 25 mg via INTRAVENOUS
  Filled 2013-04-05: qty 1

## 2013-04-05 MED ORDER — FAMOTIDINE IN NACL 20-0.9 MG/50ML-% IV SOLN
20.0000 mg | Freq: Once | INTRAVENOUS | Status: AC
  Administered 2013-04-05: 20 mg via INTRAVENOUS
  Filled 2013-04-05: qty 50

## 2013-04-05 MED ORDER — PREDNISONE 20 MG PO TABS: ORAL_TABLET | ORAL | Status: DC

## 2013-04-05 MED ORDER — METHYLPREDNISOLONE SODIUM SUCC 125 MG IJ SOLR
125.0000 mg | Freq: Once | INTRAMUSCULAR | Status: AC
  Administered 2013-04-05: 125 mg via INTRAVENOUS
  Filled 2013-04-05: qty 2

## 2013-04-05 NOTE — ED Notes (Signed)
Pt has previously been taken off lisinopril for angioedema and was recently started back on lisinopril
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
## 2013-04-05 NOTE — ED Notes (Signed)
Pt here with angioedema x 2 days; pt sts takes lisinopril; no SOB or trouble breathing; pt took some benadryl at home

## 2013-04-08 NOTE — ED Provider Notes (Signed)
Medical screening examination/treatment/procedure(s) were performed by non-physician practitioner and as supervising physician I was immediately available for consultation/collaboration.   Caylea Foronda L Ansh Fauble, MD 04/08/13 1659 

## 2013-05-26 ENCOUNTER — Emergency Department (HOSPITAL_COMMUNITY): Payer: Medicaid Other

## 2013-05-26 ENCOUNTER — Encounter (HOSPITAL_COMMUNITY): Payer: Self-pay | Admitting: Emergency Medicine

## 2013-05-26 ENCOUNTER — Emergency Department (HOSPITAL_COMMUNITY)
Admission: EM | Admit: 2013-05-26 | Discharge: 2013-05-26 | Disposition: A | Discharge status: Home / Self Care | Payer: Medicaid Other | Attending: Emergency Medicine | Admitting: Emergency Medicine

## 2013-05-26 DIAGNOSIS — R109 Unspecified abdominal pain: Secondary | ICD-10-CM

## 2013-05-26 DIAGNOSIS — I69959 Hemiplegia and hemiparesis following unspecified cerebrovascular disease affecting unspecified side: Secondary | ICD-10-CM | POA: Insufficient documentation

## 2013-05-26 DIAGNOSIS — K5900 Constipation, unspecified: Secondary | ICD-10-CM | POA: Insufficient documentation

## 2013-05-26 DIAGNOSIS — F17200 Nicotine dependence, unspecified, uncomplicated: Secondary | ICD-10-CM | POA: Insufficient documentation

## 2013-05-26 DIAGNOSIS — E785 Hyperlipidemia, unspecified: Secondary | ICD-10-CM | POA: Insufficient documentation

## 2013-05-26 DIAGNOSIS — Z79899 Other long term (current) drug therapy: Secondary | ICD-10-CM | POA: Insufficient documentation

## 2013-05-26 DIAGNOSIS — R4789 Other speech disturbances: Secondary | ICD-10-CM | POA: Insufficient documentation

## 2013-05-26 DIAGNOSIS — D72829 Elevated white blood cell count, unspecified: Secondary | ICD-10-CM | POA: Insufficient documentation

## 2013-05-26 DIAGNOSIS — Z7982 Long term (current) use of aspirin: Secondary | ICD-10-CM | POA: Insufficient documentation

## 2013-05-26 DIAGNOSIS — R1084 Generalized abdominal pain: Secondary | ICD-10-CM | POA: Insufficient documentation

## 2013-05-26 DIAGNOSIS — I10 Essential (primary) hypertension: Secondary | ICD-10-CM | POA: Insufficient documentation

## 2013-05-26 LAB — CBC WITH DIFFERENTIAL/PLATELET
Basophils Absolute: 0 10*3/uL (ref 0.0–0.1)
Basophils Relative: 0 % (ref 0–1)
Blasts: 0 %
HCT: 48.1 % (ref 39.0–52.0)
Hemoglobin: 16.8 g/dL (ref 13.0–17.0)
Lymphocytes Relative: 13 % (ref 12–46)
Lymphs Abs: 1.9 10*3/uL (ref 0.7–4.0)
MCHC: 34.9 g/dL (ref 30.0–36.0)
Monocytes Absolute: 0.6 10*3/uL (ref 0.1–1.0)
Neutro Abs: 12 10*3/uL — ABNORMAL HIGH (ref 1.7–7.7)
Neutrophils Relative %: 83 % — ABNORMAL HIGH (ref 43–77)
Platelets: ADEQUATE 10*3/uL (ref 150–400)
Promyelocytes Absolute: 0 %
RDW: 13.8 % (ref 11.5–15.5)
nRBC: 0 /100 WBC

## 2013-05-26 LAB — COMPREHENSIVE METABOLIC PANEL
ALT: 21 U/L (ref 0–53)
Albumin: 4.1 g/dL (ref 3.5–5.2)
Alkaline Phosphatase: 119 U/L — ABNORMAL HIGH (ref 39–117)
BUN: 34 mg/dL — ABNORMAL HIGH (ref 6–23)
Chloride: 95 mEq/L — ABNORMAL LOW (ref 96–112)
GFR calc Af Amer: 52 mL/min — ABNORMAL LOW (ref >90)
Glucose, Bld: 122 mg/dL — ABNORMAL HIGH (ref 70–99)
Potassium: 3.8 mEq/L (ref 3.5–5.1)
Sodium: 133 mEq/L — ABNORMAL LOW (ref 135–145)
Total Bilirubin: 0.3 mg/dL (ref 0.3–1.2)

## 2013-05-26 LAB — LIPASE, BLOOD: Lipase: 49 U/L (ref 11–59)

## 2013-05-26 LAB — URINALYSIS, ROUTINE W REFLEX MICROSCOPIC
Bilirubin Urine: NEGATIVE
Hgb urine dipstick: NEGATIVE
Ketones, ur: NEGATIVE mg/dL
Specific Gravity, Urine: 1.024 (ref 1.005–1.030)
pH: 5 (ref 5.0–8.0)

## 2013-05-26 MED ORDER — IOHEXOL 300 MG/ML  SOLN: 50.0000 mL | Freq: Once | INTRAMUSCULAR | Status: DC | PRN

## 2013-05-26 MED ORDER — IOHEXOL 300 MG/ML  SOLN: 25.0000 mL | INTRAMUSCULAR | Status: AC

## 2013-05-26 MED ORDER — IOHEXOL 300 MG/ML  SOLN
100.0000 mL | Freq: Once | INTRAMUSCULAR | Status: AC | PRN
  Administered 2013-05-26: 100 mL via INTRAVENOUS

## 2013-05-26 NOTE — ED Notes (Signed)
Pt unable to void 

## 2013-05-26 NOTE — ED Notes (Signed)
The pt has had abd pain for weeks,  No nv or diarrhea.  He has been constipated.  Lt sided weakness from a previous stroke
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
## 2013-05-27 NOTE — ED Provider Notes (Signed)
Medical screening examination/treatment/procedure(s) were performed by non-physician practitioner and as supervising physician I was immediately available for consultation/collaboration.  EKG Interpretation   None         Junius Argyle, MD 05/27/13 [phone]

## 2013-12-17 DIAGNOSIS — Z9989 Dependence on other enabling machines and devices: Secondary | ICD-10-CM | POA: Insufficient documentation

## 2013-12-17 DIAGNOSIS — W3400XA Accidental discharge from unspecified firearms or gun, initial encounter: Secondary | ICD-10-CM | POA: Insufficient documentation

## 2014-03-18 DIAGNOSIS — N529 Male erectile dysfunction, unspecified: Secondary | ICD-10-CM | POA: Insufficient documentation

## 2014-03-18 DIAGNOSIS — G8929 Other chronic pain: Secondary | ICD-10-CM | POA: Insufficient documentation

## 2014-08-18 DIAGNOSIS — Z800 Family history of malignant neoplasm of digestive organs: Secondary | ICD-10-CM | POA: Insufficient documentation

## 2015-12-07 ENCOUNTER — Encounter (HOSPITAL_COMMUNITY): Payer: Self-pay | Admitting: *Deleted

## 2015-12-07 ENCOUNTER — Emergency Department (HOSPITAL_COMMUNITY): Payer: Medicare Other

## 2015-12-07 ENCOUNTER — Emergency Department (HOSPITAL_COMMUNITY)
Admission: EM | Admit: 2015-12-07 | Discharge: 2015-12-07 | Disposition: A | Discharge status: Home / Self Care | Payer: Medicare Other | Attending: Emergency Medicine | Admitting: Emergency Medicine

## 2015-12-07 DIAGNOSIS — Z79899 Other long term (current) drug therapy: Secondary | ICD-10-CM | POA: Diagnosis not present

## 2015-12-07 DIAGNOSIS — F17210 Nicotine dependence, cigarettes, uncomplicated: Secondary | ICD-10-CM | POA: Diagnosis not present

## 2015-12-07 DIAGNOSIS — Z7982 Long term (current) use of aspirin: Secondary | ICD-10-CM | POA: Diagnosis not present

## 2015-12-07 DIAGNOSIS — E785 Hyperlipidemia, unspecified: Secondary | ICD-10-CM | POA: Insufficient documentation

## 2015-12-07 DIAGNOSIS — Z8673 Personal history of transient ischemic attack (TIA), and cerebral infarction without residual deficits: Secondary | ICD-10-CM | POA: Diagnosis not present

## 2015-12-07 DIAGNOSIS — I10 Essential (primary) hypertension: Secondary | ICD-10-CM | POA: Insufficient documentation

## 2015-12-07 DIAGNOSIS — R51 Headache: Secondary | ICD-10-CM | POA: Diagnosis present

## 2015-12-07 DIAGNOSIS — R519 Headache, unspecified: Secondary | ICD-10-CM

## 2015-12-07 LAB — CBC WITH DIFFERENTIAL/PLATELET
BASOS ABS: 0 10*3/uL (ref 0.0–0.1)
BASOS PCT: 0 %
EOS ABS: 0.3 10*3/uL (ref 0.0–0.7)
EOS PCT: 3 %
HCT: 44.3 % (ref 39.0–52.0)
Hemoglobin: 14.4 g/dL (ref 13.0–17.0)
Lymphocytes Relative: 24 %
Lymphs Abs: 2.6 10*3/uL (ref 0.7–4.0)
MCH: 28.6 pg (ref 26.0–34.0)
MCHC: 32.5 g/dL (ref 30.0–36.0)
MCV: 87.9 fL (ref 78.0–100.0)
MONO ABS: 1.1 10*3/uL — AB (ref 0.1–1.0)
MONOS PCT: 10 %
Neutro Abs: 6.7 10*3/uL (ref 1.7–7.7)
Neutrophils Relative %: 63 %
PLATELETS: 189 10*3/uL (ref 150–400)
RBC: 5.04 MIL/uL (ref 4.22–5.81)
RDW: 13.8 % (ref 11.5–15.5)
WBC: 10.7 10*3/uL — ABNORMAL HIGH (ref 4.0–10.5)

## 2015-12-07 LAB — BASIC METABOLIC PANEL
ANION GAP: 9 (ref 5–15)
BUN: 10 mg/dL (ref 6–20)
CALCIUM: 9.3 mg/dL (ref 8.9–10.3)
CO2: 29 mmol/L (ref 22–32)
Chloride: 100 mmol/L — ABNORMAL LOW (ref 101–111)
Creatinine, Ser: 1.2 mg/dL (ref 0.61–1.24)
Glucose, Bld: 101 mg/dL — ABNORMAL HIGH (ref 65–99)
Potassium: 3.4 mmol/L — ABNORMAL LOW (ref 3.5–5.1)
SODIUM: 138 mmol/L (ref 135–145)

## 2015-12-07 MED ORDER — SODIUM CHLORIDE 0.9 % IV BOLUS (SEPSIS)
500.0000 mL | Freq: Once | INTRAVENOUS | Status: AC
  Administered 2015-12-07: 500 mL via INTRAVENOUS

## 2015-12-07 MED ORDER — DIPHENHYDRAMINE HCL 50 MG/ML IJ SOLN
12.5000 mg | Freq: Once | INTRAMUSCULAR | Status: AC
  Administered 2015-12-07: 12.5 mg via INTRAVENOUS
  Filled 2015-12-07: qty 1

## 2015-12-07 MED ORDER — METOCLOPRAMIDE HCL 5 MG/ML IJ SOLN
10.0000 mg | INTRAMUSCULAR | Status: AC
  Administered 2015-12-07: 10 mg via INTRAVENOUS
  Filled 2015-12-07: qty 2

## 2015-12-07 NOTE — ED Notes (Signed)
Lisa, PA-C at bedside. 

## 2015-12-07 NOTE — ED Notes (Signed)
Pt c/o posterior headache since last night. Denies dizziness, photophobia or NV.

## 2015-12-07 NOTE — ED Notes (Signed)
Attempted IV; pt upset about getting "stuck all these times." Pt refusing second attempt at IV

## 2015-12-07 NOTE — Discharge Instructions (Signed)
May take tylenol or motrin for recurrent headache.   Follow-up with your primary care doctor. Return to the ED for new or worsening symptoms-- severe headache, numbness, new weakness, dizziness, slurred speech, confusion, etc.
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
## 2015-12-07 NOTE — ED Notes (Signed)
Pt verbalized understanding of d/c instructions and follow-up care. No further questions/concerns, VSS, ambulatory w/ steady gait (refused wheelchair) 

## 2016-04-06 ENCOUNTER — Telehealth: Payer: Self-pay

## 2019-10-14 ENCOUNTER — Ambulatory Visit: Payer: Self-pay | Attending: Internal Medicine

## 2019-12-23 ENCOUNTER — Other Ambulatory Visit: Payer: Self-pay

## 2019-12-23 ENCOUNTER — Other Ambulatory Visit: Payer: Self-pay | Admitting: Family Medicine

## 2019-12-23 ENCOUNTER — Ambulatory Visit
Admission: RE | Admit: 2019-12-23 | Discharge: 2019-12-23 | Disposition: A | Discharge status: Home / Self Care | Payer: Medicare Other | Source: Ambulatory Visit | Attending: Family Medicine | Admitting: Family Medicine

## 2019-12-23 DIAGNOSIS — M5442 Lumbago with sciatica, left side: Secondary | ICD-10-CM

## 2019-12-23 DIAGNOSIS — M25552 Pain in left hip: Secondary | ICD-10-CM

## 2020-08-11 NOTE — Patient Instructions (Addendum)
DUE TO COVID-19 ONLY ONE VISITOR IS ALLOWED TO COME WITH YOU AND STAY IN THE WAITING ROOM ONLY DURING PRE OP AND PROCEDURE.   IF YOU WILL BE ADMITTED INTO THE HOSPITAL YOU ARE ALLOWED ONE SUPPORT PERSON DURING VISITATION HOURS ONLY (10AM -8PM)   . The support person may change daily. . The support person must pass our screening, gel in and out, and wear a mask at all times, including in the patient's room. . Patients must also wear a mask when staff or their support person are in the room.   COVID SWAB TESTING MUST BE COMPLETED ON:  Friday, 08-18-20 @ 11:05 AM   [address] [place], [place] [address]  (Must self quarantine after testing. Follow instructions on handout.)        Your procedure is scheduled on:  Tuesday, 08-22-20    Report to [facility] Main  Entrance   Report to admitting at 7:50 AM   Call this number if you have problems the morning of surgery [phone]   Do not eat food :After Midnight.   May have liquids until 7:20 AM  day of surgery  CLEAR LIQUID DIET  Foods Allowed                                                                     Foods Excluded  Water, Black Coffee and tea, regular and decaf             liquids that you cannot  Plain Jell-O in any flavor  (No red)                                    see through such as: Fruit ices (not with fruit pulp)                                      milk, soups, orange juice              Iced Popsicles (No red)                                      All solid food                                   Apple juices Sports drinks like Gatorade (No red) Lightly seasoned clear broth or consume(fat free) Sugar, honey syrup     Complete one Ensure drink the morning of surgery at  7:20 AM  the day of surgery.      1. The day of surgery:  ? Drink ONE (1) Pre-Surgery Clear Ensure or G2 by am the morning of surgery. Drink in one sitting. Do not sip.  ? This drink was given to you during your hospital  pre-op  appointment visit. ? Nothing else to drink after completing the  Pre-Surgery Clear Ensure or G2.          If you have  questions, please contact your surgeon's office.     Oral Hygiene is also important to reduce your risk of infection.                                    Remember - BRUSH YOUR TEETH THE MORNING OF SURGERY WITH YOUR REGULAR TOOTHPASTE   Do NOT smoke after Midnight   Take these medicines the morning of surgery with A SIP OF WATER:  Clonidine, Metoprolol, Simvastatin                                You may not have any metal on your body including jewelry, and body piercings             Do not wear lotions, powders, perfumes/cologne, or deodorant             Men may shave face and neck.   Do not bring valuables to the hospital. [place] IS NOT RESPONSIBLE   FOR VALUABLES.   Contacts, dentures or bridgework may not be worn into surgery.   Bring small overnight bag day of surgery.                 Please read over the following fact sheets you were given: IF YOU HAVE QUESTIONS ABOUT YOUR PRE OP INSTRUCTIONS PLEASE CALL  [phone] [facility] - Preparing for Surgery Before surgery, you can play an important role.  Because skin is not sterile, your skin needs to be as free of germs as possible.  You can reduce the number of germs on your skin by washing with CHG (chlorahexidine gluconate) soap before surgery.  CHG is an antiseptic cleaner which kills germs and bonds with the skin to continue killing germs even after washing. Please DO NOT use if you have an allergy to CHG or antibacterial soaps.  If your skin becomes reddened/irritated stop using the CHG and inform your nurse when you arrive at Short Stay. Do not shave (including legs and underarms) for at least 48 hours prior to the first CHG shower.  You may shave your face/neck.  Please follow these instructions carefully:  1.  Shower with CHG Soap the night before surgery and the  morning of surgery.  2.  If  you choose to wash your hair, wash your hair first as usual with your normal  shampoo.  3.  After you shampoo, rinse your hair and body thoroughly to remove the shampoo.                             4.  Use CHG as you would any other liquid soap.  You can apply chg directly to the skin and wash.  Gently with a scrungie or clean washcloth.  5.  Apply the CHG Soap to your body ONLY FROM THE NECK DOWN.   Do   not use on face/ open                           Wound or open sores. Avoid contact with eyes, ears mouth and   genitals (private parts).                       Wash face,  Genitals (  private parts) with your normal soap.             6.  Wash thoroughly, paying special attention to the area where your    surgery  will be performed.  7.  Thoroughly rinse your body with warm water from the neck down.  8.  DO NOT shower/wash with your normal soap after using and rinsing off the CHG Soap.                9.  Pat yourself dry with a clean towel.            10.  Wear clean pajamas.            11.  Place clean sheets on your bed the night of your first shower and do not  sleep with pets. Day of Surgery : Do not apply any lotions/deodorants the morning of surgery.  Please wear clean clothes to the hospital/surgery center.  FAILURE TO FOLLOW THESE INSTRUCTIONS MAY RESULT IN THE CANCELLATION OF YOUR SURGERY  PATIENT SIGNATURE_________________________________  NURSE SIGNATURE__________________________________  ________________________________________________________________________

## 2020-08-11 NOTE — Progress Notes (Addendum)
COVID Vaccine Completed: x2 Date COVID Vaccine completed:  02-26-20 & 03-25-20 COVID vaccine manufacturer: Pfizer    Moderna   [employer]   PCP - Leilani Able, MD Cardiologist - N/A  Chest x-ray - N/A EKG - 08-14-20 in Epic Stress Test -  ECHO - 2012 in Epic Cardiac Cath -  Pacemaker/ICD device last checked:  Sleep Study - N/A CPAP -   Fasting Blood Sugar - N/A Checks Blood Sugar _____ times a day  Blood Thinner Instructions: Aspirin Instructions:  ASA 325 Last Dose: To check with Dr. Pecola Leisure today  Anesthesia review:    Patient denies shortness of breath, fever, cough and chest pain at PAT appointment.  Pt can slowly climb a flight of stairs due to hip pain and can perform some housework.   Patient verbalized understanding of instructions that were given to them at the PAT appointment. Patient was also instructed that they will need to review over the PAT instructions again at home before surgery.

## 2020-08-14 ENCOUNTER — Encounter (HOSPITAL_COMMUNITY)
Admission: RE | Admit: 2020-08-14 | Discharge: 2020-08-14 | Disposition: A | Discharge status: Home / Self Care | Payer: Medicare Other | Source: Ambulatory Visit | Attending: Orthopedic Surgery | Admitting: Orthopedic Surgery

## 2020-08-14 ENCOUNTER — Other Ambulatory Visit: Payer: Self-pay

## 2020-08-14 ENCOUNTER — Encounter (HOSPITAL_COMMUNITY): Payer: Self-pay

## 2020-08-14 DIAGNOSIS — I4510 Unspecified right bundle-branch block: Secondary | ICD-10-CM | POA: Diagnosis not present

## 2020-08-14 DIAGNOSIS — Z01818 Encounter for other preprocedural examination: Secondary | ICD-10-CM | POA: Insufficient documentation

## 2020-08-14 DIAGNOSIS — I10 Essential (primary) hypertension: Secondary | ICD-10-CM | POA: Insufficient documentation

## 2020-08-14 HISTORY — DX: Unspecified osteoarthritis, unspecified site: M19.90

## 2020-08-14 LAB — SURGICAL PCR SCREEN
MRSA, PCR: NEGATIVE
Staphylococcus aureus: POSITIVE — AB

## 2020-08-14 LAB — BASIC METABOLIC PANEL
Anion gap: 12 (ref 5–15)
BUN: 16 mg/dL (ref 8–23)
CO2: 23 mmol/L (ref 22–32)
Calcium: 9.4 mg/dL (ref 8.9–10.3)
Chloride: 105 mmol/L (ref 98–111)
Creatinine, Ser: 0.98 mg/dL (ref 0.61–1.24)
GFR, Estimated: >60 mL/min (ref >60)
Glucose, Bld: 99 mg/dL (ref 70–99)
Potassium: 3.5 mmol/L (ref 3.5–5.1)
Sodium: 140 mmol/L (ref 135–145)

## 2020-08-14 LAB — CBC
HCT: 44.6 % (ref 39.0–52.0)
Hemoglobin: 14.3 g/dL (ref 13.0–17.0)
MCH: 28.5 pg (ref 26.0–34.0)
MCHC: 32.1 g/dL (ref 30.0–36.0)
MCV: 88.8 fL (ref 80.0–100.0)
Platelets: 209 10*3/uL (ref 150–400)
RBC: 5.02 MIL/uL (ref 4.22–5.81)
RDW: 14.2 % (ref 11.5–15.5)
WBC: 10 10*3/uL (ref 4.0–10.5)
nRBC: 0 % (ref 0.0–0.2)

## 2020-08-14 NOTE — Progress Notes (Signed)
PCR results sent to Dr. Landau to review. 

## 2020-08-18 ENCOUNTER — Other Ambulatory Visit (HOSPITAL_COMMUNITY)
Admission: RE | Admit: 2020-08-18 | Discharge: 2020-08-18 | Disposition: A | Discharge status: Home / Self Care | Payer: Medicare Other | Source: Ambulatory Visit | Attending: Orthopedic Surgery | Admitting: Orthopedic Surgery

## 2020-08-18 DIAGNOSIS — Z01812 Encounter for preprocedural laboratory examination: Secondary | ICD-10-CM | POA: Diagnosis present

## 2020-08-18 DIAGNOSIS — Z20822 Contact with and (suspected) exposure to covid-19: Secondary | ICD-10-CM | POA: Diagnosis not present

## 2020-08-18 LAB — SARS CORONAVIRUS 2 (TAT 6-24 HRS): SARS Coronavirus 2: NEGATIVE

## 2020-08-24 NOTE — Progress Notes (Signed)
[name] made aware to arrive at 1115 AM 09/05/20, drink ensure at 1045AM. [name] repeated the information back to me.  Drinda Butts will call me back to schedule COVID testing appointment.

## 2020-09-01 ENCOUNTER — Other Ambulatory Visit (HOSPITAL_COMMUNITY)
Admission: RE | Admit: 2020-09-01 | Discharge: 2020-09-01 | Disposition: A | Discharge status: Home / Self Care | Payer: Medicare Other | Source: Ambulatory Visit | Attending: Orthopedic Surgery | Admitting: Orthopedic Surgery

## 2020-09-01 DIAGNOSIS — Z01812 Encounter for preprocedural laboratory examination: Secondary | ICD-10-CM | POA: Diagnosis present

## 2020-09-01 DIAGNOSIS — Z20822 Contact with and (suspected) exposure to covid-19: Secondary | ICD-10-CM | POA: Insufficient documentation

## 2020-09-01 LAB — SARS CORONAVIRUS 2 (TAT 6-24 HRS): SARS Coronavirus 2: NEGATIVE
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
## 2020-09-05 ENCOUNTER — Other Ambulatory Visit: Payer: Self-pay

## 2020-09-05 ENCOUNTER — Ambulatory Visit (HOSPITAL_COMMUNITY): Payer: Medicare Other | Admitting: Physician Assistant

## 2020-09-05 ENCOUNTER — Observation Stay (HOSPITAL_COMMUNITY): Payer: Medicare Other

## 2020-09-05 ENCOUNTER — Encounter (HOSPITAL_COMMUNITY): Payer: Self-pay | Admitting: Orthopedic Surgery

## 2020-09-05 ENCOUNTER — Ambulatory Visit (HOSPITAL_COMMUNITY): Payer: Medicare Other | Admitting: Anesthesiology

## 2020-09-05 ENCOUNTER — Encounter (HOSPITAL_COMMUNITY)
Admission: RE | Disposition: A | Discharge status: Home / Self Care | Payer: Self-pay | Source: Home / Self Care | Attending: Orthopedic Surgery

## 2020-09-05 ENCOUNTER — Observation Stay (HOSPITAL_COMMUNITY)
Admission: RE | Admit: 2020-09-05 | Discharge: 2020-09-06 | Disposition: A | Discharge status: Home / Self Care | Payer: Medicare Other | Attending: Orthopedic Surgery | Admitting: Orthopedic Surgery

## 2020-09-05 DIAGNOSIS — Z79899 Other long term (current) drug therapy: Secondary | ICD-10-CM | POA: Insufficient documentation

## 2020-09-05 DIAGNOSIS — I10 Essential (primary) hypertension: Secondary | ICD-10-CM | POA: Diagnosis not present

## 2020-09-05 DIAGNOSIS — M1612 Unilateral primary osteoarthritis, left hip: Principal | ICD-10-CM | POA: Insufficient documentation

## 2020-09-05 DIAGNOSIS — Z8673 Personal history of transient ischemic attack (TIA), and cerebral infarction without residual deficits: Secondary | ICD-10-CM | POA: Insufficient documentation

## 2020-09-05 DIAGNOSIS — Z7982 Long term (current) use of aspirin: Secondary | ICD-10-CM | POA: Insufficient documentation

## 2020-09-05 DIAGNOSIS — Z96642 Presence of left artificial hip joint: Secondary | ICD-10-CM

## 2020-09-05 DIAGNOSIS — F17210 Nicotine dependence, cigarettes, uncomplicated: Secondary | ICD-10-CM | POA: Diagnosis not present

## 2020-09-05 HISTORY — PX: TOTAL HIP ARTHROPLASTY: SHX124

## 2020-09-05 LAB — BASIC METABOLIC PANEL
Anion gap: 9 (ref 5–15)
BUN: 16 mg/dL (ref 8–23)
CO2: 24 mmol/L (ref 22–32)
Calcium: 9.2 mg/dL (ref 8.9–10.3)
Chloride: 107 mmol/L (ref 98–111)
Creatinine, Ser: 0.84 mg/dL (ref 0.61–1.24)
GFR, Estimated: >60 mL/min (ref >60)
Glucose, Bld: 58 mg/dL — ABNORMAL LOW (ref 70–99)
Potassium: 3.8 mmol/L (ref 3.5–5.1)
Sodium: 140 mmol/L (ref 135–145)

## 2020-09-05 LAB — CBC
HCT: 44 % (ref 39.0–52.0)
Hemoglobin: 14.4 g/dL (ref 13.0–17.0)
MCH: 28.9 pg (ref 26.0–34.0)
MCHC: 32.7 g/dL (ref 30.0–36.0)
MCV: 88.4 fL (ref 80.0–100.0)
Platelets: 204 10*3/uL (ref 150–400)
RBC: 4.98 MIL/uL (ref 4.22–5.81)
RDW: 14.3 % (ref 11.5–15.5)
WBC: 12.5 10*3/uL — ABNORMAL HIGH (ref 4.0–10.5)
nRBC: 0 % (ref 0.0–0.2)

## 2020-09-05 SURGERY — ARTHROPLASTY, HIP, TOTAL,POSTERIOR APPROACH
Anesthesia: Spinal | Site: Hip | Laterality: Left

## 2020-09-05 MED ORDER — METOPROLOL TARTRATE 50 MG PO TABS
100.0000 mg | ORAL_TABLET | Freq: Every day | ORAL | Status: DC
  Administered 2020-09-06: 100 mg via ORAL
  Filled 2020-09-05: qty 2

## 2020-09-05 MED ORDER — PHENYLEPHRINE 40 MCG/ML (10ML) SYRINGE FOR IV PUSH (FOR BLOOD PRESSURE SUPPORT)
PREFILLED_SYRINGE | INTRAVENOUS | Status: AC
  Filled 2020-09-05: qty 10

## 2020-09-05 MED ORDER — METOCLOPRAMIDE HCL 5 MG PO TABS: 5.0000 mg | ORAL_TABLET | Freq: Three times a day (TID) | ORAL | Status: DC | PRN

## 2020-09-05 MED ORDER — MORPHINE SULFATE (PF) 2 MG/ML IV SOLN: 0.5000 mg | INTRAVENOUS | Status: DC | PRN

## 2020-09-05 MED ORDER — PHENOL 1.4 % MT LIQD: 1.0000 | OROMUCOSAL | Status: DC | PRN

## 2020-09-05 MED ORDER — ACETAMINOPHEN 500 MG PO TABS
500.0000 mg | ORAL_TABLET | Freq: Four times a day (QID) | ORAL | Status: DC
  Administered 2020-09-05 – 2020-09-06 (×3): 500 mg via ORAL
  Filled 2020-09-05 (×3): qty 1

## 2020-09-05 MED ORDER — FENTANYL CITRATE (PF) 100 MCG/2ML IJ SOLN
INTRAMUSCULAR | Status: AC
  Filled 2020-09-05: qty 2

## 2020-09-05 MED ORDER — CLONIDINE HCL 0.1 MG PO TABS
0.1000 mg | ORAL_TABLET | Freq: Two times a day (BID) | ORAL | Status: DC
  Administered 2020-09-05 – 2020-09-06 (×2): 0.1 mg via ORAL
  Filled 2020-09-05 (×2): qty 1

## 2020-09-05 MED ORDER — MENTHOL 3 MG MT LOZG: 1.0000 | LOZENGE | OROMUCOSAL | Status: DC | PRN

## 2020-09-05 MED ORDER — DIPHENHYDRAMINE HCL 12.5 MG/5ML PO ELIX: 12.5000 mg | ORAL_SOLUTION | ORAL | Status: DC | PRN

## 2020-09-05 MED ORDER — MIDAZOLAM HCL 5 MG/5ML IJ SOLN
INTRAMUSCULAR | Status: DC | PRN
  Administered 2020-09-05: 2 mg via INTRAVENOUS

## 2020-09-05 MED ORDER — PHENYLEPHRINE HCL-NACL 10-0.9 MG/250ML-% IV SOLN
INTRAVENOUS | Status: DC | PRN
  Administered 2020-09-05: 25 ug/min via INTRAVENOUS

## 2020-09-05 MED ORDER — METHOCARBAMOL 1000 MG/10ML IJ SOLN
500.0000 mg | Freq: Four times a day (QID) | INTRAVENOUS | Status: DC | PRN
  Filled 2020-09-05: qty 5

## 2020-09-05 MED ORDER — ONDANSETRON HCL 4 MG PO TABS: 4.0000 mg | ORAL_TABLET | Freq: Four times a day (QID) | ORAL | Status: DC | PRN

## 2020-09-05 MED ORDER — BISACODYL 10 MG RE SUPP: 10.0000 mg | Freq: Every day | RECTAL | Status: DC | PRN

## 2020-09-05 MED ORDER — HYDROCODONE-ACETAMINOPHEN 5-325 MG PO TABS
1.0000 | ORAL_TABLET | ORAL | Status: DC | PRN
  Administered 2020-09-06: 1 via ORAL
  Filled 2020-09-05: qty 1

## 2020-09-05 MED ORDER — GLYCOPYRROLATE PF 0.2 MG/ML IJ SOSY
PREFILLED_SYRINGE | INTRAMUSCULAR | Status: AC
  Filled 2020-09-05: qty 1

## 2020-09-05 MED ORDER — POVIDONE-IODINE 10 % EX SWAB
2.0000 "application " | Freq: Once | CUTANEOUS | Status: AC
  Administered 2020-09-05: 2 via TOPICAL

## 2020-09-05 MED ORDER — KETOROLAC TROMETHAMINE 15 MG/ML IJ SOLN
7.5000 mg | Freq: Four times a day (QID) | INTRAMUSCULAR | Status: DC
  Administered 2020-09-06 (×2): 7.5 mg via INTRAVENOUS
  Filled 2020-09-05 (×2): qty 1

## 2020-09-05 MED ORDER — SIMVASTATIN 20 MG PO TABS: 20.0000 mg | ORAL_TABLET | Freq: Every morning | ORAL | Status: DC

## 2020-09-05 MED ORDER — CHLORHEXIDINE GLUCONATE 0.12 % MT SOLN
15.0000 mL | Freq: Once | OROMUCOSAL | Status: AC
  Administered 2020-09-05: 15 mL via OROMUCOSAL

## 2020-09-05 MED ORDER — CEFAZOLIN SODIUM-DEXTROSE 2-4 GM/100ML-% IV SOLN
2.0000 g | INTRAVENOUS | Status: AC
  Administered 2020-09-05: 2 g via INTRAVENOUS
  Filled 2020-09-05: qty 100

## 2020-09-05 MED ORDER — 0.9 % SODIUM CHLORIDE (POUR BTL) OPTIME
TOPICAL | Status: DC | PRN
  Administered 2020-09-05: 1000 mL

## 2020-09-05 MED ORDER — PHENYLEPHRINE HCL (PRESSORS) 10 MG/ML IV SOLN
INTRAVENOUS | Status: AC
  Filled 2020-09-05: qty 1

## 2020-09-05 MED ORDER — METOCLOPRAMIDE HCL 5 MG/ML IJ SOLN: 5.0000 mg | Freq: Three times a day (TID) | INTRAMUSCULAR | Status: DC | PRN

## 2020-09-05 MED ORDER — DEXAMETHASONE SODIUM PHOSPHATE 10 MG/ML IJ SOLN
INTRAMUSCULAR | Status: AC
  Filled 2020-09-05: qty 1

## 2020-09-05 MED ORDER — PROPOFOL 500 MG/50ML IV EMUL
INTRAVENOUS | Status: DC | PRN
  Administered 2020-09-05: 50 ug/kg/min via INTRAVENOUS

## 2020-09-05 MED ORDER — ACETAMINOPHEN 500 MG PO TABS
1000.0000 mg | ORAL_TABLET | Freq: Once | ORAL | Status: AC
  Administered 2020-09-05: 1000 mg via ORAL
  Filled 2020-09-05: qty 2

## 2020-09-05 MED ORDER — CELECOXIB 200 MG PO CAPS
200.0000 mg | ORAL_CAPSULE | Freq: Once | ORAL | Status: AC
  Administered 2020-09-05: 200 mg via ORAL
  Filled 2020-09-05: qty 1

## 2020-09-05 MED ORDER — MAGNESIUM CITRATE PO SOLN: 1.0000 | Freq: Once | ORAL | Status: DC | PRN

## 2020-09-05 MED ORDER — RIVAROXABAN 10 MG PO TABS
10.0000 mg | ORAL_TABLET | Freq: Every day | ORAL | Status: DC
  Administered 2020-09-06: 10 mg via ORAL
  Filled 2020-09-05: qty 1

## 2020-09-05 MED ORDER — PROPOFOL 10 MG/ML IV BOLUS
INTRAVENOUS | Status: AC
  Filled 2020-09-05: qty 20

## 2020-09-05 MED ORDER — METHOCARBAMOL 500 MG PO TABS
500.0000 mg | ORAL_TABLET | Freq: Four times a day (QID) | ORAL | Status: DC | PRN
  Administered 2020-09-06: 500 mg via ORAL
  Filled 2020-09-05: qty 1

## 2020-09-05 MED ORDER — KETOROLAC TROMETHAMINE 30 MG/ML IJ SOLN
INTRAMUSCULAR | Status: AC
  Filled 2020-09-05: qty 1

## 2020-09-05 MED ORDER — HYDROCODONE-ACETAMINOPHEN 7.5-325 MG PO TABS
1.0000 | ORAL_TABLET | ORAL | Status: DC | PRN
  Administered 2020-09-06: 1 via ORAL
  Filled 2020-09-05: qty 1

## 2020-09-05 MED ORDER — KETOROLAC TROMETHAMINE 30 MG/ML IJ SOLN
INTRAMUSCULAR | Status: DC | PRN
  Administered 2020-09-05: 30 mg

## 2020-09-05 MED ORDER — DEXAMETHASONE SODIUM PHOSPHATE 10 MG/ML IJ SOLN
10.0000 mg | Freq: Once | INTRAMUSCULAR | Status: AC
  Administered 2020-09-06: 10 mg via INTRAVENOUS
  Filled 2020-09-05: qty 1

## 2020-09-05 MED ORDER — BUPIVACAINE HCL (PF) 0.25 % IJ SOLN
INTRAMUSCULAR | Status: DC | PRN
  Administered 2020-09-05: 30 mL

## 2020-09-05 MED ORDER — POTASSIUM CHLORIDE IN NACL 20-0.45 MEQ/L-% IV SOLN
INTRAVENOUS | Status: DC
  Filled 2020-09-05 (×2): qty 1000

## 2020-09-05 MED ORDER — AMISULPRIDE (ANTIEMETIC) 5 MG/2ML IV SOLN: 10.0000 mg | Freq: Once | INTRAVENOUS | Status: DC | PRN

## 2020-09-05 MED ORDER — FENTANYL CITRATE (PF) 100 MCG/2ML IJ SOLN
INTRAMUSCULAR | Status: DC | PRN
  Administered 2020-09-05: 100 ug via INTRAVENOUS

## 2020-09-05 MED ORDER — PROPOFOL 10 MG/ML IV BOLUS
INTRAVENOUS | Status: DC | PRN
  Administered 2020-09-05: 30 mg via INTRAVENOUS

## 2020-09-05 MED ORDER — BUPIVACAINE HCL (PF) 0.25 % IJ SOLN
INTRAMUSCULAR | Status: AC
  Filled 2020-09-05: qty 30

## 2020-09-05 MED ORDER — ALUM & MAG HYDROXIDE-SIMETH 200-200-20 MG/5ML PO SUSP: 30.0000 mL | ORAL | Status: DC | PRN

## 2020-09-05 MED ORDER — ONDANSETRON HCL 4 MG/2ML IJ SOLN: 4.0000 mg | Freq: Four times a day (QID) | INTRAMUSCULAR | Status: DC | PRN

## 2020-09-05 MED ORDER — ONDANSETRON HCL 4 MG/2ML IJ SOLN
INTRAMUSCULAR | Status: AC
  Filled 2020-09-05: qty 2

## 2020-09-05 MED ORDER — LACTATED RINGERS IV SOLN: INTRAVENOUS | Status: DC

## 2020-09-05 MED ORDER — ACETAMINOPHEN 325 MG PO TABS: 325.0000 mg | ORAL_TABLET | Freq: Four times a day (QID) | ORAL | Status: DC | PRN

## 2020-09-05 MED ORDER — ONDANSETRON HCL 4 MG/2ML IJ SOLN
INTRAMUSCULAR | Status: DC | PRN
  Administered 2020-09-05: 4 mg via INTRAVENOUS

## 2020-09-05 MED ORDER — DEXAMETHASONE SODIUM PHOSPHATE 10 MG/ML IJ SOLN
INTRAMUSCULAR | Status: DC | PRN
  Administered 2020-09-05: 8 mg via INTRAVENOUS

## 2020-09-05 MED ORDER — ORAL CARE MOUTH RINSE: 15.0000 mL | Freq: Once | OROMUCOSAL | Status: AC

## 2020-09-05 MED ORDER — PROPOFOL 1000 MG/100ML IV EMUL
INTRAVENOUS | Status: AC
  Filled 2020-09-05: qty 100

## 2020-09-05 MED ORDER — MIDAZOLAM HCL 2 MG/2ML IJ SOLN
INTRAMUSCULAR | Status: AC
  Filled 2020-09-05: qty 2

## 2020-09-05 MED ORDER — TRANEXAMIC ACID-NACL 1000-0.7 MG/100ML-% IV SOLN
1000.0000 mg | INTRAVENOUS | Status: AC
  Administered 2020-09-05: 1000 mg via INTRAVENOUS
  Filled 2020-09-05: qty 100

## 2020-09-05 MED ORDER — STERILE WATER FOR IRRIGATION IR SOLN
Status: DC | PRN
Start: 2020-09-05 — End: 2020-09-05
  Administered 2020-09-05: 2000 mL

## 2020-09-05 MED ORDER — DOCUSATE SODIUM 100 MG PO CAPS
100.0000 mg | ORAL_CAPSULE | Freq: Two times a day (BID) | ORAL | Status: DC
  Administered 2020-09-05 – 2020-09-06 (×2): 100 mg via ORAL
  Filled 2020-09-05 (×2): qty 1

## 2020-09-05 MED ORDER — POLYETHYLENE GLYCOL 3350 17 G PO PACK: 17.0000 g | PACK | Freq: Every day | ORAL | Status: DC | PRN

## 2020-09-05 MED ORDER — CEFAZOLIN SODIUM-DEXTROSE 2-4 GM/100ML-% IV SOLN
2.0000 g | Freq: Four times a day (QID) | INTRAVENOUS | Status: AC
  Administered 2020-09-05 – 2020-09-06 (×2): 2 g via INTRAVENOUS
  Filled 2020-09-05 (×2): qty 100

## 2020-09-05 MED ORDER — ACETAMINOPHEN 500 MG PO TABS: 1000.0000 mg | ORAL_TABLET | Freq: Once | ORAL | Status: DC

## 2020-09-05 MED ORDER — FENTANYL CITRATE (PF) 100 MCG/2ML IJ SOLN: 25.0000 ug | INTRAMUSCULAR | Status: DC | PRN

## 2020-09-05 SURGICAL SUPPLY — 60 items
BIT DRILL 2.0X128 (BIT) ×2 IMPLANT
BLADE SAW SAG 73X25 THK (BLADE) ×1
BLADE SAW SGTL 73X25 THK (BLADE) ×1 IMPLANT
CLSR STERI-STRIP ANTIMIC 1/2X4 (GAUZE/BANDAGES/DRESSINGS) ×4 IMPLANT
COVER SURGICAL LIGHT HANDLE (MISCELLANEOUS) ×2 IMPLANT
COVER WAND RF STERILE (DRAPES) IMPLANT
CUP ACET PNNCL SECTR W/GRIP 56 (Hips) IMPLANT
DRAPE INCISE IOBAN 66X45 STRL (DRAPES) ×2 IMPLANT
DRAPE ORTHO SPLIT 77X108 STRL (DRAPES) ×4
DRAPE POUCH INSTRU U-SHP 10X18 (DRAPES) ×2 IMPLANT
DRAPE SHEET LG 3/4 BI-LAMINATE (DRAPES) ×2 IMPLANT
DRAPE SURG 17X11 SM STRL (DRAPES) ×2 IMPLANT
DRAPE SURG ORHT 6 SPLT 77X108 (DRAPES) ×2 IMPLANT
DRAPE U-SHAPE 47X51 STRL (DRAPES) ×2 IMPLANT
DRSG MEPILEX BORDER 4X12 (GAUZE/BANDAGES/DRESSINGS) ×1 IMPLANT
DRSG MEPILEX BORDER 4X8 (GAUZE/BANDAGES/DRESSINGS) ×2 IMPLANT
DURAPREP 26ML APPLICATOR (WOUND CARE) ×4 IMPLANT
ELECT BLADE TIP CTD 4 INCH (ELECTRODE) ×2 IMPLANT
ELECT REM PT RETURN 15FT ADLT (MISCELLANEOUS) ×2 IMPLANT
ELIMINATOR HOLE APEX DEPUY (Hips) ×1 IMPLANT
FACESHIELD WRAPAROUND (MASK) ×2 IMPLANT
FACESHIELD WRAPAROUND OR TEAM (MASK) ×1 IMPLANT
GLOVE SRG 8 PF TXTR STRL LF DI (GLOVE) ×1 IMPLANT
GLOVE SURG ENC MOIS LTX SZ7 (GLOVE) ×2 IMPLANT
GLOVE SURG ENC MOIS LTX SZ7.5 (GLOVE) ×2 IMPLANT
GLOVE SURG UNDER LTX SZ6.5 (GLOVE) ×2 IMPLANT
GLOVE SURG UNDER POLY LF SZ8 (GLOVE) ×2
GOWN STRL REUS W/TWL LRG LVL3 (GOWN DISPOSABLE) ×4 IMPLANT
HEAD M SROM 36MM PLUS 1.5 (Hips) IMPLANT
HOOD PEEL AWAY FLYTE STAYCOOL (MISCELLANEOUS) ×6 IMPLANT
KIT BASIN OR (CUSTOM PROCEDURE TRAY) ×2 IMPLANT
KIT TURNOVER KIT A (KITS) ×2 IMPLANT
MANIFOLD NEPTUNE II (INSTRUMENTS) ×2 IMPLANT
NDL MA TROC 1/2 (NEEDLE) IMPLANT
NDL SAFETY ECLIPSE 18X1.5 (NEEDLE) ×2 IMPLANT
NEEDLE HYPO 18GX1.5 SHARP (NEEDLE) ×4
NEEDLE MA TROC 1/2 (NEEDLE) IMPLANT
NS IRRIG 1000ML POUR BTL (IV SOLUTION) ×2 IMPLANT
PACK TOTAL JOINT (CUSTOM PROCEDURE TRAY) ×2 IMPLANT
PENCIL SMOKE EVACUATOR (MISCELLANEOUS) IMPLANT
PINN SECTOR W/GRIP ACE CUP 56 (Hips) ×2 IMPLANT
PINNACLE ALTRX PLUS 4 N 36X56 (Hips) ×1 IMPLANT
PROTECTOR NERVE ULNAR (MISCELLANEOUS) ×2 IMPLANT
RETRIEVER SUT HEWSON (MISCELLANEOUS) ×2 IMPLANT
SCREW 6.5MMX25MM (Screw) ×1 IMPLANT
SROM M HEAD 36MM PLUS 1.5 (Hips) ×2 IMPLANT
STEM OFFSET DUOFIX SZ6 STD (Stem) ×1 IMPLANT
SUCTION FRAZIER HANDLE 12FR (TUBING) ×2
SUCTION TUBE FRAZIER 12FR DISP (TUBING) ×1 IMPLANT
SUT FIBERWIRE #2 38 REV NDL BL (SUTURE) ×6
SUT VIC AB 0 CT1 36 (SUTURE) ×2 IMPLANT
SUT VIC AB 1 CT1 36 (SUTURE) ×4 IMPLANT
SUT VIC AB 2-0 CT1 27 (SUTURE) ×4
SUT VIC AB 2-0 CT1 TAPERPNT 27 (SUTURE) ×2 IMPLANT
SUT VIC AB 3-0 SH 8-18 (SUTURE) ×2 IMPLANT
SUTURE FIBERWR#2 38 REV NDL BL (SUTURE) ×3 IMPLANT
SYR CONTROL 10ML LL (SYRINGE) ×4 IMPLANT
TOWEL OR 17X26 10 PK STRL BLUE (TOWEL DISPOSABLE) ×2 IMPLANT
TRAY FOLEY MTR SLVR 16FR STAT (SET/KITS/TRAYS/PACK) ×2 IMPLANT
WATER STERILE IRR 1000ML POUR (IV SOLUTION) ×4 IMPLANT

## 2020-09-05 NOTE — Anesthesia Procedure Notes (Signed)
Spinal  Patient location during procedure: OR End time: 09/05/2020 1:53 PM Staffing Resident/CRNA: Lissa Morales, CRNA Preanesthetic Checklist Completed: patient identified, IV checked, risks and benefits discussed, surgical consent, monitors and equipment checked, pre-op evaluation and timeout performed Spinal Block Patient position: sitting Prep: DuraPrep Patient monitoring: heart rate, continuous pulse ox and blood pressure Approach: midline Location: L3-4 Injection technique: single-shot Needle Needle type: Pencan  Needle gauge: 24 G Needle length: 9 cm Additional Notes Expiration date of kit checked and confirmed. Patient tolerated procedure well, without complications.

## 2020-09-05 NOTE — Discharge Instructions (Addendum)
INSTRUCTIONS AFTER JOINT REPLACEMENT  ° °o Remove items at home which could result in a fall. This includes throw rugs or furniture in walking pathways °o ICE to the affected joint every three hours while awake for 30 minutes at a time, for at least the first 3-5 days, and then as needed for pain and swelling.  Continue to use ice for pain and swelling. You may notice swelling that will progress down to the foot and ankle.  This is normal after surgery.  Elevate your leg when you are not up walking on it.   °o Continue to use the breathing machine you got in the hospital (incentive spirometer) which will help keep your temperature down.  It is common for your temperature to cycle up and down following surgery, especially at night when you are not up moving around and exerting yourself.  The breathing machine keeps your lungs expanded and your temperature down. ° ° °DIET:  As you were doing prior to hospitalization, we recommend a well-balanced diet. ° °DRESSING / WOUND CARE / SHOWERING ° °You may change your dressing 3-5 days after surgery.  Then change the dressing every day with sterile gauze.  Please use good hand washing techniques before changing the dressing.  Do not use any lotions or creams on the incision until instructed by your surgeon. ° °ACTIVITY ° °o Increase activity slowly as tolerated, but follow the weight bearing instructions below.   °o No driving for 6 weeks or until further direction given by your physician.  You cannot drive while taking narcotics.  °o No lifting or carrying greater than 10 lbs. until further directed by your surgeon. °o Avoid periods of inactivity such as sitting longer than an hour when not asleep. This helps prevent blood clots.  °o You may return to work once you are authorized by your doctor.  ° ° ° °WEIGHT BEARING  ° °Weight bearing as tolerated with assist device (walker, cane, etc) as directed, use it as long as suggested by your surgeon or therapist, typically at  least 4-6 weeks. ° ° °EXERCISES ° °Results after joint replacement surgery are often greatly improved when you follow the exercise, range of motion and muscle strengthening exercises prescribed by your doctor. Safety measures are also important to protect the joint from further injury. Any time any of these exercises cause you to have increased pain or swelling, decrease what you are doing until you are comfortable again and then slowly increase them. If you have problems or questions, call your caregiver or physical therapist for advice.  ° °Rehabilitation is important following a joint replacement. After just a few days of immobilization, the muscles of the leg can become weakened and shrink (atrophy).  These exercises are designed to build up the tone and strength of the thigh and leg muscles and to improve motion. Often times heat used for twenty to thirty minutes before working out will loosen up your tissues and help with improving the range of motion but do not use heat for the first two weeks following surgery (sometimes heat can increase post-operative swelling).  ° °These exercises can be done on a training (exercise) mat, on the floor, on a table or on a bed. Use whatever works the best and is most comfortable for you.    Use music or television while you are exercising so that the exercises are a pleasant break in your day. This will make your life better with the exercises acting as a break   in your routine that you can look forward to.   Perform all exercises about fifteen times, three times per day or as directed.  You should exercise both the operative leg and the other leg as well. ° °Exercises include: °  °• Quad Sets - Tighten up the muscle on the front of the thigh (Quad) and hold for 5-10 seconds.   °• Straight Leg Raises - With your knee straight (if you were given a brace, keep it on), lift the leg to 60 degrees, hold for 3 seconds, and slowly lower the leg.  Perform this exercise against  resistance later as your leg gets stronger.  °• Leg Slides: Lying on your back, slowly slide your foot toward your buttocks, bending your knee up off the floor (only go as far as is comfortable). Then slowly slide your foot back down until your leg is flat on the floor again.  °• Angel Wings: Lying on your back spread your legs to the side as far apart as you can without causing discomfort.  °• Hamstring Strength:  Lying on your back, push your heel against the floor with your leg straight by tightening up the muscles of your buttocks.  Repeat, but this time bend your knee to a comfortable angle, and push your heel against the floor.  You may put a pillow under the heel to make it more comfortable if necessary.  ° °A rehabilitation program following joint replacement surgery can speed recovery and prevent re-injury in the future due to weakened muscles. Contact your doctor or a physical therapist for more information on knee rehabilitation.  ° ° °CONSTIPATION ° °Constipation is defined medically as fewer than three stools per week and severe constipation as less than one stool per week.  Even if you have a regular bowel pattern at home, your normal regimen is likely to be disrupted due to multiple reasons following surgery.  Combination of anesthesia, postoperative narcotics, change in appetite and fluid intake all can affect your bowels.  ° °YOU MUST use at least one of the following options; they are listed in order of increasing strength to get the job done.  They are all available over the counter, and you may need to use some, POSSIBLY even all of these options:   ° °Drink plenty of fluids (prune juice may be helpful) and high fiber foods °Colace 100 mg by mouth twice a day  °Senokot for constipation as directed and as needed Dulcolax (bisacodyl), take with full glass of water  °Miralax (polyethylene glycol) once or twice a day as needed. ° °If you have tried all these things and are unable to have a bowel  movement in the first 3-4 days after surgery call either your surgeon or your primary doctor.   ° °If you experience loose stools or diarrhea, hold the medications until you stool forms back up.  If your symptoms do not get better within 1 week or if they get worse, check with your doctor.  If you experience "the worst abdominal pain ever" or develop nausea or vomiting, please contact the office immediately for further recommendations for treatment. ° ° °ITCHING:  If you experience itching with your medications, try taking only a single pain pill, or even half a pain pill at a time.  You can also use Benadryl over the counter for itching or also to help with sleep.  ° °TED HOSE STOCKINGS:  Use stockings on both legs until for at least 2 weeks or as   directed by physician office. They may be removed at night for sleeping.  MEDICATIONS:  See your medication summary on the After Visit Summary that nursing will review with you.  You may have some home medications which will be placed on hold until you complete the course of blood thinner medication.  It is important for you to complete the blood thinner medication as prescribed.  PRECAUTIONS:  If you experience chest pain or shortness of breath - call 911 immediately for transfer to the hospital emergency department.   If you develop a fever greater that 101 F, purulent drainage from wound, increased redness or drainage from wound, foul odor from the wound/dressing, or calf pain - CONTACT YOUR SURGEON.                                                   FOLLOW-UP APPOINTMENTS:  If you do not already have a post-op appointment, please call the office for an appointment to be seen by your surgeon.  Guidelines for how soon to be seen are listed in your After Visit Summary, but are typically between 1-4 weeks after surgery.    DENTAL ANTIBIOTICS:  In most cases prophylactic antibiotics for Dental procdeures after total joint surgery are not  necessary.  Exceptions are as follows:  1. History of prior total joint infection  2. Severely immunocompromised (Organ Transplant, cancer chemotherapy, Rheumatoid biologic meds such as Humera)  3. Poorly controlled diabetes (A1C &gt; 8.0, blood glucose over 200)  If you have one of these conditions, contact your surgeon for an antibiotic prescription, prior to your dental procedure.   MAKE SURE YOU:   Understand these instructions.   Get help right away if you are not doing well or get worse.    Thank you for letting us be a part of your medical care team.  It is a privilege we respect greatly.  We hope these instructions will help you stay on track for a fast and full recovery!   ______________________________________________________________________________________________________________________________  Information on my medicine - XARELTO (Rivaroxaban)   Why was Xarelto prescribed for you? Xarelto was prescribed for you to reduce the risk of blood clots forming after orthopedic surgery. The medical term for these abnormal blood clots is venous thromboembolism (VTE).  What do you need to know about xarelto ? Take your Xarelto ONCE DAILY at the same time every day. You may take it either with or without food.  If you have difficulty swallowing the tablet whole, you may crush it and mix in applesauce just prior to taking your dose.  Take Xarelto exactly as prescribed by your doctor and DO NOT stop taking Xarelto without talking to the doctor who prescribed the medication.  Stopping without other VTE prevention medication to take the place of Xarelto may increase your risk of developing a clot.  After discharge, you should have regular check-up appointments with your healthcare provider that is prescribing your Xarelto.    What do you do if you miss a dose? If you miss a dose, take it as soon as you remember on the same day then continue your regularly scheduled  once daily regimen the next day. Do not take two doses of Xarelto on the same day.   Important Safety Information A possible side effect of Xarelto is bleeding. You should call your healthcare provider  right away if you experience any of the following: ? Bleeding from an injury or your nose that does not stop. ? Unusual colored urine (red or dark brown) or unusual colored stools (red or black). ? Unusual bruising for unknown reasons. ? A serious fall or if you hit your head (even if there is no bleeding).  Some medicines may interact with Xarelto and might increase your risk of bleeding while on Xarelto. To help avoid this, consult your healthcare provider or pharmacist prior to using any new prescription or non-prescription medications, including herbals, vitamins, non-steroidal anti-inflammatory drugs (NSAIDs) and supplements.  This website has more information on Xarelto: [email or web address]

## 2020-09-05 NOTE — Interval H&P Note (Signed)
History and Physical Interval Note:  09/05/2020 12:47 PM  Carl Gilbert  has presented today for surgery, with the diagnosis of djd right hip.  The various methods of treatment have been discussed with the patient and family. After consideration of risks, benefits and other options for treatment, the patient has consented to  Procedure(s): TOTAL HIP ARTHROPLASTY (Left) as a surgical intervention.  The patient's history has been reviewed, patient examined, no change in status, stable for surgery.  I have reviewed the patient's chart and labs.  Questions were answered to the patient's satisfaction.     Eulas Post

## 2020-09-05 NOTE — Anesthesia Postprocedure Evaluation (Signed)
Anesthesia Post Note  Patient: Scientist, research (physical sciences)  Procedure(s) Performed: TOTAL HIP ARTHROPLASTY (Left Hip)     Patient location during evaluation: PACU Anesthesia Type: Spinal Level of consciousness: awake and alert Pain management: pain level controlled Vital Signs Assessment: post-procedure vital signs reviewed and stable Respiratory status: spontaneous breathing and respiratory function stable Cardiovascular status: blood pressure returned to baseline and stable Postop Assessment: spinal receding Anesthetic complications: no   No complications documented.  Last Vitals:  Vitals:   09/05/20 1645 09/05/20 1700  BP: (!) 150/89 (!) 164/90  Pulse: (!) 55 (!) 58  Resp: 16 14  Temp:  (!) 36.4 C  SpO2: 100% 100%    Last Pain:  Vitals:   09/05/20 1700  PainSc: 0-No pain    LLE Motor Response: Purposeful movement (09/05/20 1700)   RLE Motor Response: Purposeful movement (09/05/20 1700)   L Sensory Level: S1-Sole of foot, small toes (09/05/20 1700) R Sensory Level: L4-Anterior knee, lower leg (09/05/20 1700)  Hjalmer Iovino DANIEL

## 2020-09-05 NOTE — Anesthesia Procedure Notes (Signed)
Procedure Name: MAC Date/Time: 09/05/2020 1:44 PM Performed by: Lissa Morales, CRNA Pre-anesthesia Checklist: Patient identified, Emergency Drugs available, Suction available, Patient being monitored and Timeout performed Patient Re-evaluated:Patient Re-evaluated prior to induction Oxygen Delivery Method: Simple face mask Placement Confirmation: positive ETCO2

## 2020-09-05 NOTE — Anesthesia Preprocedure Evaluation (Addendum)
Anesthesia Evaluation  Patient identified by MRN, date of birth, ID band Patient awake    Reviewed: Allergy & Precautions, NPO status , Patient's Chart, lab work & pertinent test results  History of Anesthesia Complications Negative for: history of anesthetic complications  Airway Mallampati: I  TM Distance: >3 FB Neck ROM: Full    Dental  (+) Edentulous Upper, Edentulous Lower, Dental Advisory Given   Pulmonary Current Smoker and Patient abstained from smoking.,    Pulmonary exam normal        Cardiovascular hypertension, Normal cardiovascular exam     Neuro/Psych CVA, Residual Symptoms    GI/Hepatic negative GI ROS, Neg liver ROS,   Endo/Other  negative endocrine ROS  Renal/GU negative Renal ROS     Musculoskeletal negative musculoskeletal ROS (+)   Abdominal   Peds  Hematology negative hematology ROS (+)   Anesthesia Other Findings   Reproductive/Obstetrics                            Anesthesia Physical Anesthesia Plan  ASA: III  Anesthesia Plan: Spinal   Post-op Pain Management:    Induction:   PONV Risk Score and Plan: Ondansetron and Propofol infusion  Airway Management Planned: Natural Airway  Additional Equipment:   Intra-op Plan:   Post-operative Plan:   Informed Consent: I have reviewed the patients History and Physical, chart, labs and discussed the procedure including the risks, benefits and alternatives for the proposed anesthesia with the patient or authorized representative who has indicated his/her understanding and acceptance.     Dental advisory given  Plan Discussed with: Anesthesiologist and CRNA  Anesthesia Plan Comments:        Anesthesia Quick Evaluation

## 2020-09-05 NOTE — Op Note (Signed)
09/05/2020  3:36 PM  PATIENT:  Carl Gilbert   MRN: [id number]  PRE-OPERATIVE DIAGNOSIS:  Left hip severe osteoarthritis with collapse  POST-OPERATIVE DIAGNOSIS:  same  PROCEDURE:  Procedure(s): TOTAL HIP ARTHROPLASTY  PREOPERATIVE INDICATIONS:    Carl Gilbert is an 71 y.o. male who has a diagnosis of Left hip severe osteoarthritis with collapse and elected for surgical management after failing conservative treatment.  The risks benefits and alternatives were discussed with the patient including but not limited to the risks of nonoperative treatment, versus surgical intervention including infection, bleeding, nerve injury, periprosthetic fracture, the need for revision surgery, dislocation, leg length discrepancy, blood clots, cardiopulmonary complications, morbidity, mortality, among others, and they were willing to proceed.     OPERATIVE REPORT     SURGEON:  Marchia Bond, MD    ASSISTANT:  Merlene Pulling, PA-C, (Present throughout the entire procedure,  necessary for completion of procedure in a timely manner, assisting with retraction, instrumentation, and closure)     ANESTHESIA:  spinal  ESTIMATED BLOOD LOSS: 650 ml    COMPLICATIONS:  None.     UNIQUE ASPECTS OF THE CASE:  The hip felt quite short.  The acetabulum had extensive wear and medialization already with circumferential osteophytes making it quite difficult to obtain reliable landmarks.  The final stem sat a little bit more proud than I expected, I had 1 tooth showing on the broach, but probably two teeth worth on the real implant.  Nonetheless I had excellent stability, leg lengths felt symmetric, and the soft tissue did reduce reasonably well.  I did not medialize the acetabulum much at all because it was already worn.  COMPONENTS:  [employer] fit femur size 6 with a 36 mm + 1.5 metallic head ball and a Gription Acetabular shell size 56, with a single cancellous screw for backup fixation, with an  apex hole eliminator and a +4 neutral polyethylene liner.    PROCEDURE IN DETAIL:   The patient was met in the holding area and  identified.  The appropriate hip was identified and marked at the operative site.  The patient was then transported to the OR  and  placed under anesthesia.  At that point, the patient was  placed in the lateral decubitus position with the operative side up and  secured to the operating room table and all bony prominences padded.     The operative lower extremity was prepped from the iliac crest to the distal leg.  Sterile draping was performed.  Time out was performed prior to incision.      A routine posterolateral approach was utilized via sharp dissection  carried down to the subcutaneous tissue.  Gross bleeders were Bovie coagulated.  The iliotibial band was identified and incised along the length of the skin incision.  Self-retaining retractors were  inserted.  With the hip internally rotated, the short external rotators  were identified. The piriformis and capsule was tagged with FiberWire, and the hip capsule released in a T-type fashion.  The femoral neck was exposed, and I resected the femoral neck using the appropriate jig. This was performed at approximately a thumb's breadth above the lesser trochanter.    I then exposed the deep acetabulum, cleared out any tissue including the ligamentum teres.  A wing retractor was placed but didn't hold so it was abandoned.  After adequate visualization, I excised the labrum, and then sequentially reamed.  I placed the trial acetabulum, which seated nicely, and then impacted the  real cup into place.  Appropriate version and inclination was confirmed clinically matching their bony anatomy, and also with the use of the jig.  I placed a cancellous screw to augment fixation.  A trial polyethylene liner was placed and the wing retractor removed.    I then prepared the proximal femur using the cookie-cutter, the lateralizing  reamer, and then sequentially reamed and broached.  A trial broach, neck, and head was utilized, and I reduced the hip and it was found to have excellent stability with functional range of motion. The trial components were then removed, and the real polyethylene liner was placed.  I then impacted the real femoral prosthesis into place into the appropriate version, slightly anteverted to the normal anatomy, and I impacted the real head ball into place. The hip was then reduced and taken through functional range of motion and found to have excellent stability. Leg lengths were restored.  I then used a 2 mm drill bits to pass the FiberWire suture from the capsule and piriformis through the greater trochanter, and secured this. Excellent posterior capsular repair was achieved. I also closed the T in the capsule.  I then irrigated the hip copiously again with pulse lavage, and repaired the fascia with Vicryl, followed by Vicryl for the subcutaneous tissue, Monocryl for the skin, Steri-Strips and sterile gauze. The wounds were injected. The patient was then awakened and returned to PACU in stable and satisfactory condition. There were no complications.  Marchia Bond, MD Orthopedic Surgeon [phone]   09/05/2020 3:36 PM

## 2020-09-05 NOTE — Transfer of Care (Signed)
Immediate Anesthesia Transfer of Care Note  Patient: Carl Gilbert  Procedure(s) Performed: TOTAL HIP ARTHROPLASTY (Left Hip)  Patient Location: PACU  Anesthesia Type:Spinal  Level of Consciousness: awake, alert , oriented and patient cooperative  Airway & Oxygen Therapy: Patient Spontanous Breathing and Patient connected to face mask oxygen  Post-op Assessment: Report given to RN and Post -op Vital signs reviewed and stable  Post vital signs: stable  Last Vitals:  Vitals Value Taken Time  BP 131/80 09/05/20 1556  Temp    Pulse 58 09/05/20 1600  Resp 21 09/05/20 1600  SpO2 100 % 09/05/20 1600  Vitals shown include unvalidated device data.  Last Pain:  Vitals:   09/05/20 1124  PainSc: 0-No pain      Patients Stated Pain Goal: 3 (09/05/20 1124)  Complications: No complications documented.

## 2020-09-06 DIAGNOSIS — M1612 Unilateral primary osteoarthritis, left hip: Secondary | ICD-10-CM | POA: Diagnosis not present

## 2020-09-06 LAB — BASIC METABOLIC PANEL
Anion gap: 8 (ref 5–15)
BUN: 17 mg/dL (ref 8–23)
CO2: 25 mmol/L (ref 22–32)
Calcium: 9 mg/dL (ref 8.9–10.3)
Chloride: 105 mmol/L (ref 98–111)
Creatinine, Ser: 0.99 mg/dL (ref 0.61–1.24)
GFR, Estimated: >60 mL/min (ref >60)
Glucose, Bld: 122 mg/dL — ABNORMAL HIGH (ref 70–99)
Potassium: 3.9 mmol/L (ref 3.5–5.1)
Sodium: 138 mmol/L (ref 135–145)

## 2020-09-06 LAB — CBC
HCT: 40.9 % (ref 39.0–52.0)
Hemoglobin: 13.1 g/dL (ref 13.0–17.0)
MCH: 28.9 pg (ref 26.0–34.0)
MCHC: 32 g/dL (ref 30.0–36.0)
MCV: 90.1 fL (ref 80.0–100.0)
Platelets: 185 10*3/uL (ref 150–400)
RBC: 4.54 MIL/uL (ref 4.22–5.81)
RDW: 14 % (ref 11.5–15.5)
WBC: 15.7 10*3/uL — ABNORMAL HIGH (ref 4.0–10.5)
nRBC: 0 % (ref 0.0–0.2)

## 2020-09-06 MED ORDER — HYDROCODONE-ACETAMINOPHEN 10-325 MG PO TABS: 1.0000 | ORAL_TABLET | Freq: Four times a day (QID) | ORAL | 0 refills | Status: DC | PRN

## 2020-09-06 MED ORDER — RIVAROXABAN 10 MG PO TABS: 10.0000 mg | ORAL_TABLET | Freq: Every day | ORAL | 0 refills | Status: DC

## 2020-09-06 MED ORDER — ONDANSETRON HCL 4 MG PO TABS: 4.0000 mg | ORAL_TABLET | Freq: Three times a day (TID) | ORAL | 0 refills | Status: DC | PRN

## 2020-09-06 MED ORDER — SENNA-DOCUSATE SODIUM 8.6-50 MG PO TABS: 2.0000 | ORAL_TABLET | Freq: Every day | ORAL | 1 refills | Status: DC

## 2020-09-06 NOTE — Plan of Care (Signed)
Pt ready for DC home with girlfriend

## 2020-09-06 NOTE — Progress Notes (Signed)
Subjective: 1 Day Post-Op s/p Procedure(s): TOTAL HIP ARTHROPLASTY   Patient is alert, oriented. Patient reports pain as mild this morning. Denies chest pain, SOB, Calf pain. No nausea/vomiting.  No other complaints.  Objective:  PE: VITALS:   Vitals:   09/05/20 2046 09/05/20 2315 09/06/20 0048 09/06/20 0509  BP: (!) 162/82  (!) 146/89 (!) 161/92  Pulse: 69  69 (!) 59  Resp: (!) 22  18 16   Temp: 98.6 F (37 C)  98.6 F (37 C) (!) 97.4 F (36.3 C)  TempSrc: Oral  Oral Oral  SpO2: 100%  100% 100%  Weight:  81.6 kg    Height:  5\' 9"  (1.753 m)      ABD soft Neurovascular intact Sensation intact distally Intact pulses distally Dorsiflexion/Plantar flexion intact Incision: dressing C/D/I  LABS  Results for orders placed or performed during the hospital encounter of 09/05/20 (from the past 24 hour(s))  CBC     Status: Abnormal   Collection Time: 09/05/20 12:16 PM  Result Value Ref Range   WBC 12.5 (H) 4.0 - 10.5 K/uL   RBC 4.98 4.22 - 5.81 MIL/uL   Hemoglobin 14.4 13.0 - 17.0 g/dL   HCT 09/07/20 09/07/20 - 02.5 %   MCV 88.4 80.0 - 100.0 fL   MCH 28.9 26.0 - 34.0 pg   MCHC 32.7 30.0 - 36.0 g/dL   RDW 42.7 06.2 - 37.6 %   Platelets 204 150 - 400 K/uL   nRBC 0.0 0.0 - 0.2 %  Basic metabolic panel     Status: Abnormal   Collection Time: 09/05/20 12:16 PM  Result Value Ref Range   Sodium 140 135 - 145 mmol/L   Potassium 3.8 3.5 - 5.1 mmol/L   Chloride 107 98 - 111 mmol/L   CO2 24 22 - 32 mmol/L   Glucose, Bld 58 (L) 70 - 99 mg/dL   BUN 16 8 - 23 mg/dL   Creatinine, Ser 15.1 0.61 - 1.24 mg/dL   Calcium 9.2 8.9 - 09/07/20 mg/dL   GFR, Estimated 7.61 60.7 mL/min   Anion gap 9 5 - 15  CBC     Status: Abnormal   Collection Time: 09/06/20  3:37 AM  Result Value Ref Range   WBC 15.7 (H) 4.0 - 10.5 K/uL   RBC 4.54 4.22 - 5.81 MIL/uL   Hemoglobin 13.1 13.0 - 17.0 g/dL   HCT >10 09/08/20 - 62.6 %   MCV 90.1 80.0 - 100.0 fL   MCH 28.9 26.0 - 34.0 pg   MCHC 32.0 30.0 - 36.0 g/dL    RDW 94.8 54.6 - 27.0 %   Platelets 185 150 - 400 K/uL   nRBC 0.0 0.0 - 0.2 %  Basic metabolic panel     Status: Abnormal   Collection Time: 09/06/20  3:37 AM  Result Value Ref Range   Sodium 138 135 - 145 mmol/L   Potassium 3.9 3.5 - 5.1 mmol/L   Chloride 105 98 - 111 mmol/L   CO2 25 22 - 32 mmol/L   Glucose, Bld 122 (H) 70 - 99 mg/dL   BUN 17 8 - 23 mg/dL   Creatinine, Ser 09.3 0.61 - 1.24 mg/dL   Calcium 9.0 8.9 - 09/08/20 mg/dL   GFR, Estimated 8.18 29.9 mL/min   Anion gap 8 5 - 15    DG Pelvis Portable  Result Date: 09/05/2020 CLINICAL DATA:  Status post left hip surgery today. EXAM: PORTABLE PELVIS 1-2 VIEWS COMPARISON:  None. FINDINGS: A total left hip replacement is seen. There is no evidence of surrounding lucency to suggest the presence of hardware loosening or infection. There is no evidence of an acute pelvic fracture or diastasis. Soft tissue air is seen surrounding the left femoral prosthesis with a mild amount of soft tissue air also noted along the lateral aspect of the proximal left lower extremity. IMPRESSION: Total left hip replacement without evidence of complication. Electronically Signed   By: Aram Candela M.D.   On: 09/05/2020 23:27   DG Hip Port Unilat With Pelvis 1V Left  Result Date: 09/05/2020 CLINICAL DATA:  Status post left hip surgery today. EXAM: DG HIP (WITH OR WITHOUT PELVIS) 1V PORT LEFT COMPARISON:  None. FINDINGS: A total left hip replacement is seen without evidence of surrounding lucency to suggest the presence of hardware loosening or infection. There is no evidence of an acute hip fracture or dislocation. Soft tissue structures are unremarkable. IMPRESSION: Left hip replacement without evidence of complications. Electronically Signed   By: Aram Candela M.D.   On: 09/05/2020 23:28    Assessment/Plan: Active Problems:   S/P total left hip arthroplasty   S/P hip replacement, left  1 Day Post-Op s/p Procedure(s): TOTAL HIP  ARTHROPLASTY  Weightbearing: WBAT LLE Insicional and dressing care: Reinforce dressings as needed VTE prophylaxis: Xarelto x 30 days Pain control: continue current regimen Follow - up plan: 2 weeks with Dr. Dion Saucier Dispo: pending PT eval today  Contact information:   Weekdays 8-5 Janine Ores, PA-C [phone] A fter hours and holidays please check [email or web address] for group call information for [facility]  Armida Sans 09/06/2020, 8:32 AM

## 2020-09-06 NOTE — TOC Transition Note (Signed)
Transition of Care [facility]) - CM/SW Discharge Note   Patient Details  Name: Carl Gilbert MRN: [id number] Date of Birth: 1950-12-28  Transition of Care [facility]) CM/SW Contact:  Clearance Coots, LCSW Phone Number: 09/06/2020, 10:25 AM   Clinical Narrative:    Prearranged therapy plan: HHPT [facility] delivered DME-RW and 3 in1 to the patient bedside.    Final next level of care: Home w Home Health Services Barriers to Discharge: No Barriers Identified   Patient Goals and CMS Choice        Discharge Placement                      Discharge Plan and Services                DME Arranged: 3-N-1,Walker rolling DME Agency: [facility] Date DME Agency Contacted: 09/06/20 Time DME Agency Contacted: [phone] Representative spoke with at DME Agency: Harrold Donath HH Arranged: PT HH Agency: [facility] Date HH Agency Contacted: 09/06/20 Time HH Agency Contacted: [phone] Representative spoke with at [facility] Agency: Kathlene November  Social Determinants of Health (SDOH) Interventions     Readmission Risk Interventions No flowsheet data found.

## 2020-09-06 NOTE — Discharge Summary (Signed)
Discharge Summary  Patient ID: Carl Gilbert MRN: [id number] DOB/AGE: 71-30-1952 71 y.o.  Admit date: 09/05/2020 Discharge date: 09/06/2020  Admission Diagnoses:  Left hip osteoarthritis   Discharge Diagnoses:  Active Problems:   S/P total left hip arthroplasty   S/P hip replacement, left   Past Medical History:  Diagnosis Date  . Arthritis   . Hyperlipidemia   . Hypertension   . Stroke [facility])     Surgeries: Procedure(s): TOTAL HIP ARTHROPLASTY on 09/05/2020   Consultants (if any):   Discharged Condition: Improved  Hospital Course: Carl Gilbert is an 70 y.o. male who was admitted 09/05/2020 with a diagnosis of left hip osteoarthritis and went to the operating room on 09/05/2020 and underwent the above named procedures.    He was given perioperative antibiotics:  Anti-infectives (From admission, onward)   Start     Dose/Rate Route Frequency Ordered Stop   09/05/20 2000  ceFAZolin (ANCEF) IVPB 2g/100 mL premix        2 g 200 mL/hr over 30 Minutes Intravenous Every 6 hours 09/05/20 1747 09/06/20 0215   09/05/20 1145  ceFAZolin (ANCEF) IVPB 2g/100 mL premix        2 g 200 mL/hr over 30 Minutes Intravenous On call to O.R. 09/05/20 1139 09/05/20 1347    .  He was given sequential compression devices, early ambulation, and Xarelto for DVT prophylaxis.  He benefited maximally from the hospital stay and there were no complications.    Recent vital signs:  Vitals:   09/06/20 0048 09/06/20 0509  BP: (!) 146/89 (!) 161/92  Pulse: 69 (!) 59  Resp: 18 16  Temp: 98.6 F (37 C) (!) 97.4 F (36.3 C)  SpO2: 100% 100%    Recent laboratory studies:  Lab Results  Component Value Date   HGB 13.1 09/06/2020   HGB 14.4 09/05/2020   HGB 14.3 08/14/2020   Lab Results  Component Value Date   WBC 15.7 (H) 09/06/2020   PLT 185 09/06/2020   Lab Results  Component Value Date   INR 1.09 06/12/2011   Lab Results  Component Value Date   NA 138 09/06/2020   K 3.9  09/06/2020   CL 105 09/06/2020   CO2 25 09/06/2020   BUN 17 09/06/2020   CREATININE 0.99 09/06/2020   GLUCOSE 122 (H) 09/06/2020    Discharge Medications:   Allergies as of 09/06/2020      Reactions   Lisinopril Swelling   "swelling of face and lips"      Medication List    STOP taking these medications   acetaminophen 500 MG tablet Commonly known as: TYLENOL   aspirin EC 325 MG tablet     TAKE these medications   cloNIDine 0.1 MG tablet Commonly known as: CATAPRES Take 0.1 mg by mouth 2 (two) times daily.   HYDROcodone-acetaminophen 10-325 MG tablet Commonly known as: Norco Take 1 tablet by mouth every 6 (six) hours as needed.   metoprolol tartrate 100 MG tablet Commonly known as: LOPRESSOR Take 100 mg by mouth daily.   ondansetron 4 MG tablet Commonly known as: Zofran Take 1 tablet (4 mg total) by mouth every 8 (eight) hours as needed for nausea or vomiting.   rivaroxaban 10 MG Tabs tablet Commonly known as: Xarelto Take 1 tablet (10 mg total) by mouth daily.   sennosides-docusate sodium 8.6-50 MG tablet Commonly known as: SENOKOT-S Take 2 tablets by mouth daily.   simvastatin 20 MG tablet Commonly known as: ZOCOR Take 20 mg  by mouth every morning.            Durable Medical Equipment  (From admission, onward)         Start     Ordered   09/06/20 0930  For home use only DME 3 n 1  Once        09/06/20 0929   09/06/20 0929  For home use only DME Walker rolling  Once       Question Answer Comment  Walker: With 5 Inch Wheels   Patient needs a walker to treat with the following condition S/P hip replacement, left      09/06/20 0929          Diagnostic Studies: DG Pelvis Portable  Result Date: 09/05/2020 CLINICAL DATA:  Status post left hip surgery today. EXAM: PORTABLE PELVIS 1-2 VIEWS COMPARISON:  None. FINDINGS: A total left hip replacement is seen. There is no evidence of surrounding lucency to suggest the presence of hardware loosening  or infection. There is no evidence of an acute pelvic fracture or diastasis. Soft tissue air is seen surrounding the left femoral prosthesis with a mild amount of soft tissue air also noted along the lateral aspect of the proximal left lower extremity. IMPRESSION: Total left hip replacement without evidence of complication. Electronically Signed   By: Aram Candela M.D.   On: 09/05/2020 23:27   DG Hip Port Unilat With Pelvis 1V Left  Result Date: 09/05/2020 CLINICAL DATA:  Status post left hip surgery today. EXAM: DG HIP (WITH OR WITHOUT PELVIS) 1V PORT LEFT COMPARISON:  None. FINDINGS: A total left hip replacement is seen without evidence of surrounding lucency to suggest the presence of hardware loosening or infection. There is no evidence of an acute hip fracture or dislocation. Soft tissue structures are unremarkable. IMPRESSION: Left hip replacement without evidence of complications. Electronically Signed   By: Aram Candela M.D.   On: 09/05/2020 23:28    Disposition: Discharge disposition: 01-Home or Self Care          Follow-up Information    Teryl Lucy, MD. Schedule an appointment as soon as possible for a visit in 2 weeks.   Specialty: Orthopedic Surgery Contact information: [address] [place] [address] [phone]                Signed: Annita Brod 09/06/2020, 9:34 AM

## 2020-09-06 NOTE — Evaluation (Signed)
Physical Therapy Evaluation Patient Details Name: Carl Gilbert MRN: [id number] DOB: [birth year] Today's Date: 09/06/2020   History of Present Illness  Carl Gilbert is a 70 y.o. 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 Pt with h/o CVA with residual speech impairment. Pt s/p L THR  Clinical Impression  Pt is s/p L THR. Pt expressed desire to d/c home as soon as possible. Pt educated on posterior THP and given a handout on precautions and HEP. Pt had great recall of precautions and will have good support at home. Pt is currently mod independent with RW on the unit and completed stair training with min guard assist. Pt mod indep with HEP and is ready for d/c home today. Pt will benefit from continued skilled HHPT to maximize mobility and Independence. Pt is d/c from acute PT services.     Follow Up Recommendations Home health PT;Follow surgeon's recommendation for DC plan and follow-up therapies    Equipment Recommendations  Rolling walker with 5" wheels;3in1 (PT)    Recommendations for Other Services       Precautions / Restrictions Precautions Precautions: Posterior Hip Precaution Booklet Issued: Yes (comment) Precaution Comments: Reviewed and pt acknowledged. Restrictions Weight Bearing Restrictions: No Other Position/Activity Restrictions: WBAT      Mobility  Bed Mobility Overal bed mobility: Modified Independent             General bed mobility comments: verbal cues for technique to maintain THP    Transfers Overall transfer level: Modified independent Equipment used: Rolling walker (2 wheeled)             General transfer comment: cues for technique for improved safety  Ambulation/Gait Ambulation/Gait assistance: Modified independent (Device/Increase time) Gait Distance (Feet): 220 Feet Assistive device: Rolling walker (2 wheeled) Gait Pattern/deviations: Step-through pattern;Decreased stride length Gait velocity: decreased   General Gait  Details: cues for staying inside walker for improved safety  Stairs Stairs: Yes Stairs assistance: Min guard Stair Management: One rail Right;Sideways Number of Stairs: 3 General stair comments: assistance with walker management  Wheelchair Mobility    Modified Rankin (Stroke Patients Only)       Balance Overall balance assessment: No apparent balance deficits (not formally assessed) Sitting-balance support: No upper extremity supported;Feet supported Sitting balance-Leahy Scale: Normal     Standing balance support: Bilateral upper extremity supported Standing balance-Leahy Scale: Fair                               Pertinent Vitals/Pain Pain Assessment: 0-10 Pain Score: 5  Pain Location: Left hip Pain Descriptors / Indicators: Discomfort Pain Intervention(s): Limited activity within patient's tolerance;Repositioned;Monitored during session;Ice applied    Home Living Family/patient expects to be discharged to:: Private residence Living Arrangements: Spouse/significant other Available Help at Discharge: Family Type of Home: House Home Access: Stairs to enter Entrance Stairs-Rails: Right Entrance Stairs-Number of Steps: 3-4 Home Layout: One level Home Equipment: Cane - single point      Prior Function Level of Independence: Independent with assistive device(s)               Hand Dominance        Extremity/Trunk Assessment   Upper Extremity Assessment Upper Extremity Assessment: Defer to OT evaluation    Lower Extremity Assessment Lower Extremity Assessment: LLE deficits/detail LLE Deficits / Details: 3/5    Cervical / Trunk Assessment Cervical / Trunk Assessment: Normal  Communication   Communication: Expressive difficulties  Cognition Arousal/Alertness: Awake/alert Behavior During Therapy: WFL for tasks assessed/performed Overall Cognitive Status: Within Functional Limits for tasks assessed                                  General Comments: some difficulty with word finding due to previous CVA      General Comments General comments (skin integrity, edema, etc.): Pt very motivated to go home today. Good recall of precautions and understanding of mobility with RW. Will have his sisters available to help as needed for 2 days and girlfriend will also be able to assist as needed.    Exercises Total Joint Exercises Ankle Circles/Pumps: AROM;Strengthening;Both;15 reps;Supine Quad Sets: AROM;Strengthening;Left;15 reps;Supine Heel Slides: AROM;Strengthening;Left;15 reps;Supine Hip ABduction/ADduction: AROM;Strengthening;Left;15 reps;Supine Long Arc Quad: AROM;Strengthening;Left;15 reps;Seated   Assessment/Plan    PT Assessment All further PT needs can be met in the next venue of care  PT Problem List Decreased strength;Decreased balance;Decreased knowledge of precautions;Pain;Decreased mobility;Decreased activity tolerance;Decreased safety awareness       PT Treatment Interventions      PT Goals (Current goals can be found in the Care Plan section)  Acute Rehab PT Goals Patient Stated Goal: to go home today    Frequency     Barriers to discharge        Co-evaluation               AM-PAC PT "6 Clicks" Mobility  Outcome Measure Help needed turning from your back to your side while in a flat bed without using bedrails?: None   Help needed moving to and from a bed to a chair (including a wheelchair)?: None Help needed standing up from a chair using your arms (e.g., wheelchair or bedside chair)?: None Help needed to walk in hospital room?: None Help needed climbing 3-5 steps with a railing? : A Little 6 Click Score: 19    End of Session Equipment Utilized During Treatment: Gait belt Activity Tolerance: Patient tolerated treatment well Patient left: in chair;with call bell/phone within reach Nurse Communication: Mobility status PT Visit Diagnosis: Muscle weakness (generalized)  (M62.81);Difficulty in walking, not elsewhere classified (R26.2)    Time: 5726-2035 PT Time Calculation (min) (ACUTE ONLY): 53 min   Charges:   PT Evaluation $PT Eval Low Complexity: 1 Low PT Treatments $Gait Training: 8-22 mins $Therapeutic Exercise: 8-22 mins $Therapeutic Activity: 8-22 mins        Lelon Mast 09/06/2020, 9:32 AM

## 2020-09-07 ENCOUNTER — Encounter (HOSPITAL_COMMUNITY): Payer: Self-pay | Admitting: Orthopedic Surgery

## 2021-03-11 ENCOUNTER — Encounter (HOSPITAL_COMMUNITY): Payer: Self-pay | Admitting: Emergency Medicine

## 2021-03-11 ENCOUNTER — Emergency Department (HOSPITAL_COMMUNITY): Payer: Medicare Other

## 2021-03-11 ENCOUNTER — Inpatient Hospital Stay (HOSPITAL_COMMUNITY)
Admission: EM | Admit: 2021-03-11 | Discharge: 2021-03-14 | DRG: 065 | Disposition: A | Discharge status: Home Health | Payer: Medicare Other | Attending: Internal Medicine | Admitting: Internal Medicine

## 2021-03-11 DIAGNOSIS — E876 Hypokalemia: Secondary | ICD-10-CM | POA: Diagnosis present

## 2021-03-11 DIAGNOSIS — E785 Hyperlipidemia, unspecified: Secondary | ICD-10-CM

## 2021-03-11 DIAGNOSIS — R270 Ataxia, unspecified: Secondary | ICD-10-CM | POA: Diagnosis present

## 2021-03-11 DIAGNOSIS — Z96642 Presence of left artificial hip joint: Secondary | ICD-10-CM | POA: Diagnosis present

## 2021-03-11 DIAGNOSIS — R29700 NIHSS score 0: Secondary | ICD-10-CM | POA: Diagnosis present

## 2021-03-11 DIAGNOSIS — I69354 Hemiplegia and hemiparesis following cerebral infarction affecting left non-dominant side: Secondary | ICD-10-CM

## 2021-03-11 DIAGNOSIS — Z79899 Other long term (current) drug therapy: Secondary | ICD-10-CM

## 2021-03-11 DIAGNOSIS — F17210 Nicotine dependence, cigarettes, uncomplicated: Secondary | ICD-10-CM | POA: Diagnosis present

## 2021-03-11 DIAGNOSIS — Z20822 Contact with and (suspected) exposure to covid-19: Secondary | ICD-10-CM | POA: Diagnosis present

## 2021-03-11 DIAGNOSIS — I10 Essential (primary) hypertension: Secondary | ICD-10-CM | POA: Diagnosis not present

## 2021-03-11 DIAGNOSIS — Z888 Allergy status to other drugs, medicaments and biological substances status: Secondary | ICD-10-CM

## 2021-03-11 DIAGNOSIS — I6502 Occlusion and stenosis of left vertebral artery: Secondary | ICD-10-CM | POA: Diagnosis present

## 2021-03-11 DIAGNOSIS — I63542 Cerebral infarction due to unspecified occlusion or stenosis of left cerebellar artery: Principal | ICD-10-CM | POA: Diagnosis present

## 2021-03-11 DIAGNOSIS — Z8673 Personal history of transient ischemic attack (TIA), and cerebral infarction without residual deficits: Secondary | ICD-10-CM

## 2021-03-11 DIAGNOSIS — R29818 Other symptoms and signs involving the nervous system: Secondary | ICD-10-CM | POA: Diagnosis not present

## 2021-03-11 DIAGNOSIS — R001 Bradycardia, unspecified: Secondary | ICD-10-CM | POA: Diagnosis present

## 2021-03-11 DIAGNOSIS — Z7901 Long term (current) use of anticoagulants: Secondary | ICD-10-CM

## 2021-03-11 DIAGNOSIS — I639 Cerebral infarction, unspecified: Secondary | ICD-10-CM | POA: Diagnosis not present

## 2021-03-11 DIAGNOSIS — I651 Occlusion and stenosis of basilar artery: Secondary | ICD-10-CM | POA: Diagnosis present

## 2021-03-11 DIAGNOSIS — I69320 Aphasia following cerebral infarction: Secondary | ICD-10-CM

## 2021-03-11 DIAGNOSIS — G9389 Other specified disorders of brain: Secondary | ICD-10-CM | POA: Diagnosis present

## 2021-03-11 DIAGNOSIS — Z85038 Personal history of other malignant neoplasm of large intestine: Secondary | ICD-10-CM

## 2021-03-11 LAB — CBC WITH DIFFERENTIAL/PLATELET
Abs Immature Granulocytes: 0.02 10*3/uL (ref 0.00–0.07)
Basophils Absolute: 0.1 10*3/uL (ref 0.0–0.1)
Basophils Relative: 1 %
Eosinophils Absolute: 0.2 10*3/uL (ref 0.0–0.5)
Eosinophils Relative: 2 %
HCT: 48.6 % (ref 39.0–52.0)
Hemoglobin: 15.9 g/dL (ref 13.0–17.0)
Immature Granulocytes: 0 %
Lymphocytes Relative: 25 %
Lymphs Abs: 2.6 10*3/uL (ref 0.7–4.0)
MCH: 28.4 pg (ref 26.0–34.0)
MCHC: 32.7 g/dL (ref 30.0–36.0)
MCV: 86.9 fL (ref 80.0–100.0)
Monocytes Absolute: 0.7 10*3/uL (ref 0.1–1.0)
Monocytes Relative: 7 %
Neutro Abs: 6.6 10*3/uL (ref 1.7–7.7)
Neutrophils Relative %: 65 %
Platelets: 218 10*3/uL (ref 150–400)
RBC: 5.59 MIL/uL (ref 4.22–5.81)
RDW: 14 % (ref 11.5–15.5)
WBC: 10.2 10*3/uL (ref 4.0–10.5)
nRBC: 0 % (ref 0.0–0.2)

## 2021-03-11 LAB — COMPREHENSIVE METABOLIC PANEL
ALT: 19 U/L (ref 0–44)
AST: 17 U/L (ref 15–41)
Albumin: 3.8 g/dL (ref 3.5–5.0)
Alkaline Phosphatase: 122 U/L (ref 38–126)
Anion gap: 9 (ref 5–15)
BUN: 11 mg/dL (ref 8–23)
CO2: 24 mmol/L (ref 22–32)
Calcium: 9 mg/dL (ref 8.9–10.3)
Chloride: 102 mmol/L (ref 98–111)
Creatinine, Ser: 0.91 mg/dL (ref 0.61–1.24)
GFR, Estimated: >60 mL/min (ref >60)
Glucose, Bld: 140 mg/dL — ABNORMAL HIGH (ref 70–99)
Potassium: 3.5 mmol/L (ref 3.5–5.1)
Sodium: 135 mmol/L (ref 135–145)
Total Bilirubin: 0.9 mg/dL (ref 0.3–1.2)
Total Protein: 7.4 g/dL (ref 6.5–8.1)

## 2021-03-11 LAB — I-STAT CHEM 8, ED
BUN: 11 mg/dL (ref 8–23)
Calcium, Ion: 1.17 mmol/L (ref 1.15–1.40)
Chloride: 103 mmol/L (ref 98–111)
Creatinine, Ser: 0.8 mg/dL (ref 0.61–1.24)
Glucose, Bld: 135 mg/dL — ABNORMAL HIGH (ref 70–99)
HCT: 50 % (ref 39.0–52.0)
Hemoglobin: 17 g/dL (ref 13.0–17.0)
Potassium: 3.5 mmol/L (ref 3.5–5.1)
Sodium: 139 mmol/L (ref 135–145)
TCO2: 26 mmol/L (ref 22–32)

## 2021-03-11 LAB — RESP PANEL BY RT-PCR (FLU A&B, COVID) ARPGX2
Influenza A by PCR: NEGATIVE
Influenza B by PCR: NEGATIVE
SARS Coronavirus 2 by RT PCR: NEGATIVE

## 2021-03-11 LAB — TROPONIN I (HIGH SENSITIVITY)
Troponin I (High Sensitivity): 11 ng/L (ref <18)
Troponin I (High Sensitivity): 10 ng/L (ref <18)

## 2021-03-11 LAB — LIPASE, BLOOD: Lipase: 28 U/L (ref 11–51)

## 2021-03-11 MED ORDER — IOHEXOL 350 MG/ML SOLN
50.0000 mL | Freq: Once | INTRAVENOUS | Status: AC | PRN
  Administered 2021-03-11: 50 mL via INTRAVENOUS

## 2021-03-11 MED ORDER — DIPHENHYDRAMINE HCL 50 MG/ML IJ SOLN
25.0000 mg | Freq: Once | INTRAMUSCULAR | Status: AC
  Administered 2021-03-11: 25 mg via INTRAVENOUS
  Filled 2021-03-11: qty 1

## 2021-03-11 MED ORDER — SIMVASTATIN 20 MG PO TABS
20.0000 mg | ORAL_TABLET | Freq: Every morning | ORAL | Status: DC
  Administered 2021-03-12: 20 mg via ORAL
  Filled 2021-03-11: qty 1

## 2021-03-11 MED ORDER — ACETAMINOPHEN 325 MG PO TABS
650.0000 mg | ORAL_TABLET | ORAL | Status: DC | PRN
  Filled 2021-03-11: qty 2

## 2021-03-11 MED ORDER — PROCHLORPERAZINE EDISYLATE 10 MG/2ML IJ SOLN
10.0000 mg | Freq: Once | INTRAMUSCULAR | Status: AC
  Administered 2021-03-11: 10 mg via INTRAVENOUS
  Filled 2021-03-11: qty 2

## 2021-03-11 MED ORDER — SENNOSIDES-DOCUSATE SODIUM 8.6-50 MG PO TABS: 1.0000 | ORAL_TABLET | Freq: Every evening | ORAL | Status: DC | PRN

## 2021-03-11 MED ORDER — ACETAMINOPHEN 650 MG RE SUPP: 650.0000 mg | RECTAL | Status: DC | PRN

## 2021-03-11 MED ORDER — ACETAMINOPHEN 160 MG/5ML PO SOLN: 650.0000 mg | ORAL | Status: DC | PRN

## 2021-03-11 MED ORDER — STROKE: EARLY STAGES OF RECOVERY BOOK: Freq: Once | Status: DC

## 2021-03-11 NOTE — ED Triage Notes (Signed)
Pt here from home with c/o htn and headache, cbg 92 , stroke screen negative with ems , refused zofran from [facility]
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
## 2021-03-11 NOTE — ED Provider Notes (Signed)
Emergency Medicine Provider Triage Evaluation Note  Carl Gilbert , Gilbert 70 y.o. male  was evaluated in triage.  Pt complains of headache, lightheadedness, dizziness. Began yesterday. No head trauma. States does not typically get headaches. No numbness, weakness, slurred speech, chest pain, sob.  Review of Systems  Positive: Headache, weakness, dizziness Negative: CP, SOB  Physical Exam  There were no vitals taken for this visit. Gen:   Awake, no distress   Resp:  Normal effort  MSK:   Moves extremities without difficulty  Neuro:  CN 2-12 grossly intact, Equal grip, intact sensation Other:    Medical Decision Making  Medically screening exam initiated at 7:04 PM.  Appropriate orders placed.  Carl Gilbert was informed that the remainder of the evaluation will be completed by another provider, this initial triage assessment does not replace that evaluation, and the importance of remaining in the ED until their evaluation is complete.  Headache, dizziness, weakness   EKG with some ST changes. EKG given to MD in back by tech. Trop ordered however denies CP   Carl Colton A, PA-C 03/11/21 1907    Carl Sleeper, MD 03/12/21 [id number]

## 2021-03-12 ENCOUNTER — Observation Stay (HOSPITAL_COMMUNITY): Payer: Medicare Other

## 2021-03-12 ENCOUNTER — Encounter (HOSPITAL_COMMUNITY): Payer: Self-pay | Admitting: Internal Medicine

## 2021-03-12 ENCOUNTER — Other Ambulatory Visit: Payer: Self-pay

## 2021-03-12 DIAGNOSIS — I69320 Aphasia following cerebral infarction: Secondary | ICD-10-CM | POA: Diagnosis not present

## 2021-03-12 DIAGNOSIS — E876 Hypokalemia: Secondary | ICD-10-CM | POA: Diagnosis present

## 2021-03-12 DIAGNOSIS — I651 Occlusion and stenosis of basilar artery: Secondary | ICD-10-CM | POA: Diagnosis present

## 2021-03-12 DIAGNOSIS — R29700 NIHSS score 0: Secondary | ICD-10-CM | POA: Diagnosis present

## 2021-03-12 DIAGNOSIS — Z85038 Personal history of other malignant neoplasm of large intestine: Secondary | ICD-10-CM | POA: Diagnosis not present

## 2021-03-12 DIAGNOSIS — E785 Hyperlipidemia, unspecified: Secondary | ICD-10-CM | POA: Diagnosis present

## 2021-03-12 DIAGNOSIS — I6389 Other cerebral infarction: Secondary | ICD-10-CM | POA: Diagnosis not present

## 2021-03-12 DIAGNOSIS — Z96642 Presence of left artificial hip joint: Secondary | ICD-10-CM | POA: Diagnosis present

## 2021-03-12 DIAGNOSIS — Z79899 Other long term (current) drug therapy: Secondary | ICD-10-CM | POA: Diagnosis not present

## 2021-03-12 DIAGNOSIS — I6502 Occlusion and stenosis of left vertebral artery: Secondary | ICD-10-CM | POA: Diagnosis present

## 2021-03-12 DIAGNOSIS — Z20822 Contact with and (suspected) exposure to covid-19: Secondary | ICD-10-CM | POA: Diagnosis present

## 2021-03-12 DIAGNOSIS — F17210 Nicotine dependence, cigarettes, uncomplicated: Secondary | ICD-10-CM | POA: Diagnosis present

## 2021-03-12 DIAGNOSIS — I639 Cerebral infarction, unspecified: Secondary | ICD-10-CM | POA: Diagnosis not present

## 2021-03-12 DIAGNOSIS — Z7901 Long term (current) use of anticoagulants: Secondary | ICD-10-CM | POA: Diagnosis not present

## 2021-03-12 DIAGNOSIS — R29818 Other symptoms and signs involving the nervous system: Secondary | ICD-10-CM | POA: Diagnosis present

## 2021-03-12 DIAGNOSIS — I63542 Cerebral infarction due to unspecified occlusion or stenosis of left cerebellar artery: Secondary | ICD-10-CM | POA: Diagnosis present

## 2021-03-12 DIAGNOSIS — R001 Bradycardia, unspecified: Secondary | ICD-10-CM | POA: Diagnosis present

## 2021-03-12 DIAGNOSIS — I69354 Hemiplegia and hemiparesis following cerebral infarction affecting left non-dominant side: Secondary | ICD-10-CM | POA: Diagnosis not present

## 2021-03-12 DIAGNOSIS — R270 Ataxia, unspecified: Secondary | ICD-10-CM | POA: Diagnosis present

## 2021-03-12 DIAGNOSIS — G9389 Other specified disorders of brain: Secondary | ICD-10-CM | POA: Diagnosis present

## 2021-03-12 DIAGNOSIS — I10 Essential (primary) hypertension: Secondary | ICD-10-CM | POA: Diagnosis present

## 2021-03-12 DIAGNOSIS — Z888 Allergy status to other drugs, medicaments and biological substances status: Secondary | ICD-10-CM | POA: Diagnosis not present

## 2021-03-12 LAB — HEMOGLOBIN A1C
Hgb A1c MFr Bld: 6 % — ABNORMAL HIGH (ref 4.8–5.6)
Mean Plasma Glucose: 125.5 mg/dL

## 2021-03-12 LAB — ECHOCARDIOGRAM COMPLETE
AR max vel: 2.52 cm2
AV Area VTI: 2.66 cm2
AV Area mean vel: 2.39 cm2
AV Mean grad: 4 mmHg
AV Peak grad: 6.3 mmHg
Ao pk vel: 1.25 m/s
Area-P 1/2: 2.64 cm2
Calc EF: 67.4 %
MV VTI: 2.18 cm2
S' Lateral: 2.2 cm
Single Plane A2C EF: 77.6 %
Single Plane A4C EF: 47.1 %

## 2021-03-12 LAB — LIPID PANEL
Cholesterol: 130 mg/dL (ref 0–200)
HDL: 42 mg/dL (ref >40)
LDL Cholesterol: 77 mg/dL (ref 0–99)
Total CHOL/HDL Ratio: 3.1 RATIO
Triglycerides: 55 mg/dL (ref <150)
VLDL: 11 mg/dL (ref 0–40)

## 2021-03-12 LAB — HIV ANTIBODY (ROUTINE TESTING W REFLEX): HIV Screen 4th Generation wRfx: NONREACTIVE

## 2021-03-12 MED ORDER — CLOPIDOGREL BISULFATE 75 MG PO TABS
75.0000 mg | ORAL_TABLET | Freq: Every day | ORAL | Status: DC
  Administered 2021-03-14: 75 mg via ORAL
  Filled 2021-03-12: qty 1

## 2021-03-12 MED ORDER — KETOROLAC TROMETHAMINE 15 MG/ML IJ SOLN
15.0000 mg | Freq: Once | INTRAMUSCULAR | Status: AC | PRN
  Administered 2021-03-12: 15 mg via INTRAVENOUS
  Filled 2021-03-12: qty 1

## 2021-03-12 MED ORDER — HYDRALAZINE HCL 20 MG/ML IJ SOLN
5.0000 mg | Freq: Four times a day (QID) | INTRAMUSCULAR | Status: DC | PRN
  Administered 2021-03-12: 5 mg via INTRAVENOUS
  Filled 2021-03-12: qty 1

## 2021-03-12 MED ORDER — HYDRALAZINE HCL 20 MG/ML IJ SOLN
5.0000 mg | INTRAMUSCULAR | Status: DC | PRN
  Administered 2021-03-12: 5 mg via INTRAVENOUS
  Filled 2021-03-12: qty 1

## 2021-03-12 MED ORDER — CLOPIDOGREL BISULFATE 300 MG PO TABS
300.0000 mg | ORAL_TABLET | Freq: Once | ORAL | Status: AC
  Administered 2021-03-12: 300 mg via ORAL
  Filled 2021-03-12: qty 1

## 2021-03-12 MED ORDER — ONDANSETRON HCL 4 MG/2ML IJ SOLN
4.0000 mg | Freq: Four times a day (QID) | INTRAMUSCULAR | Status: DC | PRN
  Administered 2021-03-12: 4 mg via INTRAVENOUS
  Filled 2021-03-12: qty 2

## 2021-03-12 MED ORDER — HYDRALAZINE HCL 20 MG/ML IJ SOLN: 5.0000 mg | Freq: Four times a day (QID) | INTRAMUSCULAR | Status: DC | PRN

## 2021-03-12 MED ORDER — ASPIRIN 325 MG PO TABS: 325.0000 mg | ORAL_TABLET | Freq: Once | ORAL | Status: DC

## 2021-03-12 MED ORDER — ATORVASTATIN CALCIUM 40 MG PO TABS
40.0000 mg | ORAL_TABLET | Freq: Every day | ORAL | Status: DC
  Administered 2021-03-13: 40 mg via ORAL
  Filled 2021-03-12: qty 1

## 2021-03-12 MED ORDER — ASPIRIN EC 81 MG PO TBEC
81.0000 mg | DELAYED_RELEASE_TABLET | Freq: Every day | ORAL | Status: DC
  Administered 2021-03-14: 81 mg via ORAL
  Filled 2021-03-12: qty 1

## 2021-03-12 NOTE — Progress Notes (Signed)
OT Cancellation Note  Patient Details Name: Carl Gilbert MRN: [id number] DOB: November 01, 1950   Cancelled Treatment:    Reason Eval/Treat Not Completed: Patient at procedure or test/ unavailable (echo)  [name],[name] 03/12/2021, 10:54 AM

## 2021-03-12 NOTE — Progress Notes (Signed)
Inpatient Rehab Admissions Coordinator Note:   Per PT/OT patient was screened for CIR candidacy by Liberato Stansbery Luvenia Starch, CCC-SLP. At this time, pt appears to be a potential candidate for CIR. I will place an order for rehab consult for full assessment, per our protocol.  Please contact me any with questions.Wolfgang Phoenix, MS, CCC-SLP Admissions Coordinator [phone] 03/12/21 5:54 PM

## 2021-03-12 NOTE — Progress Notes (Signed)
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 No increase in right ventricular wall thickness. Right ventricular systolic function is normal. Tricuspid regurgitation signal is inadequate for assessing PA pressure. Left Atrium: Left atrial size was normal in size. Right Atrium: Right atrial size was normal in size. Pericardium: Trivial pericardial effusion is present. Mitral Valve: The mitral valve is normal in structure. Trivial mitral valve regurgitation. No evidence of mitral valve stenosis. MV peak gradient, 5.4 mmHg. The mean mitral valve gradient is 2.0 mmHg. Tricuspid Valve: The tricuspid valve is normal in structure. Tricuspid valve regurgitation is not demonstrated. Aortic Valve: The aortic valve is tricuspid. Aortic valve regurgitation is not visualized. Mild aortic valve sclerosis is present, with no evidence of aortic valve stenosis. Aortic valve mean gradient measures 4.0 mmHg. Aortic valve peak gradient measures 6.2 mmHg. Aortic valve area, by VTI measures 2.66 cm. Pulmonic Valve: The pulmonic valve was normal in  structure. Pulmonic valve regurgitation is trivial. Aorta: The aortic root is normal in size and structure. Venous: The inferior vena cava is normal in size with greater than 50% respiratory variability, suggesting right atrial pressure of 3 mmHg. IAS/Shunts: No atrial level shunt detected by color flow Doppler.  LEFT VENTRICLE PLAX 2D LVIDd:         4.10 cm     Diastology LVIDs:         2.20 cm     LV e' medial:    3.70 cm/s LV PW:         1.40 cm     LV E/e' medial:  20.6 LV IVS:        1.30 cm     LV e' lateral:   7.29 cm/s LVOT diam:     1.90 cm     LV E/e' lateral: 10.5 LV SV:         75 LV SV Index:   38 LVOT Area:     2.84 cm  LV Volumes (MOD) LV vol d, MOD A2C: 27.9 ml LV vol d, MOD A4C: 44.4 ml LV vol s, MOD A2C: 6.2 ml LV vol s, MOD A4C: 23.5 ml LV SV MOD A2C:     21.7 ml LV SV MOD A4C:     44.4 ml LV SV MOD BP:      24.8 ml RIGHT VENTRICLE RV Basal diam:  2.30 cm RV Mid diam:    1.30 cm TAPSE (M-mode): 2.2 cm LEFT ATRIUM             Index       RIGHT ATRIUM  Index LA Vol (A2C):   34.9 ml 17.68 ml/m RA Area:     9.61 cm LA Vol (A4C):   44.6 ml 22.58 ml/m RA Volume:   20.40 ml 10.33 ml/m LA Biplane Vol: 47.7 ml 24.15 ml/m  AORTIC VALVE                   PULMONIC VALVE AV Area (Vmax):    2.52 cm    PV Vmax:          0.92 m/s AV Area (Vmean):   2.39 cm    PV Vmean:         69.700 cm/s AV Area (VTI):     2.66 cm    PV VTI:           0.247 m AV Vmax:           125.00 cm/s PV Peak grad:     3.4 mmHg AV Vmean:          90.200 cm/s PV Mean grad:     2.0 mmHg AV VTI:            0.284 m     PR End Diast Vel: 5.86 msec AV Peak Grad:      6.2 mmHg AV Mean Grad:      4.0 mmHg LVOT Vmax:         111.00 cm/s LVOT Vmean:        75.900 cm/s LVOT VTI:          0.266 m LVOT/AV VTI ratio: 0.94  AORTA Ao Root diam: 3.70 cm Ao Asc diam:  2.90 cm MITRAL VALVE MV Area (PHT): 2.64 cm     SHUNTS MV Area VTI:   2.18 cm     Systemic VTI:  0.27 m MV Peak grad:  5.4 mmHg     Systemic Diam: 1.90 cm MV Mean grad:  2.0  mmHg MV Vmax:       1.16 m/s MV Vmean:      69.8 cm/s MV Decel Time: 287 msec MV E velocity: 76.20 cm/s MV A velocity: 120.00 cm/s MV E/A ratio:  0.64 Dalton McleanMD Electronically signed by Wilfred Lacy Signature Date/Time: 03/12/2021/10:07:15 AM    Final         Scheduled Meds:   stroke: mapping our early stages of recovery book   Does not apply Once   [START ON 03/13/2021] aspirin EC  81 mg Oral Daily   [START ON 03/13/2021] clopidogrel  75 mg Oral Daily   simvastatin  20 mg Oral q morning   Continuous Infusions:   LOS: 0 days    Time spent: 35 minutes.     Alba Cory, MD [facility]   If 7PM-7AM, please contact night-coverage [email or web address]  03/12/2021, 2:08 PM

## 2021-03-12 NOTE — Progress Notes (Addendum)
STROKE TEAM PROGRESS NOTE    Interval History   No acute events overnight, patient is resting in bed with no family at bedside. Inquired about his stroke history- he states that he had a stroke about 4 years ago that left him with residual right leg weakness.   Spoke with IR given occluded L V4 on CTA head and Neck- they will take him for angio when able to.   He is an active smoker- 1 pack over the course of 2 days typically, counseled to stop smoking    Pertinent Lab Work and Imaging    03/11/21 CT Head WO IV Contrast There is no mass, hemorrhage or extra-axial collection. There is generalized atrophy without lobar predilection. There is an old left parietal infarct. There is hypoattenuation of the periventricular white matter, most commonly indicating chronic ischemic microangiopathy.  03/11/21 CT Angio Head and Neck W WO IV Contrast 1. 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 These findings are age indeterminate. 2. 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 3. 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 4. 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  03/12/21 MRI Brain WO IV Contrast 1. Two punctate acute infarcts are identified in the anterior left cerebellum and nearby dorsal left brainstem. No associated hemorrhage or mass effect.   2. Evidence of the poor flow in the dominant distal Left Vertebral Artery and proximal Basilar Artery as seen by CTA yesterday.   3. 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 Chronic posterior left MCA territory ischemia and encephalomalacia,some of which was acute in 2012. And progressed, advanced chronic small vessel disease in the thalami and bilateral cerebral white matter since that time.  03/12/21 Echocardiogram Complete    1. Left ventricular ejection fraction, by estimation, is 60 to 65%. The  left ventricle has normal function. The left ventricle has no regional  wall motion abnormalities. There is mild left ventricular hypertrophy.  Left ventricular diastolic parameters  are consistent with Grade I diastolic dysfunction (impaired relaxation).   2. Right ventricular systolic function is normal. The right ventricular  size is normal. Tricuspid regurgitation signal is inadequate for assessing  PA pressure.   3. The mitral valve is normal in structure. Trivial mitral valve  regurgitation. No evidence of mitral stenosis.   4. The aortic valve is tricuspid. Aortic valve regurgitation is not  visualized. Mild aortic valve sclerosis is present, with no evidence of  aortic valve stenosis.   5. The inferior vena cava is normal in size with greater than 50%  respiratory variability, suggesting right atrial pressure of 3 mmHg.   Physical Examination   Constitutional: Calm, appropriate for condition  Cardiovascular: Normal RR Respiratory: No increased WOB   Mental status: AAOx4, following commands  Speech: Fluent, repetition and naming intact  Cranial nerves: EOMI with left esotropia and horizontal nystagmus bilateral lateral gaze., VFF, Face symmetric, Tongue midline  Motor: Normal bulk and tone. No drift. Antigravity throughout  Sensory: Intact to light touch throughout  Coordination: Mild ataxia to the LUE  Gait: Deferred   Assessment and Plan   [name] is a 70 y.o. male w/pmh of  HLD, HTN, prior stroke and arthritis who presents with dizziness, light headedness and headache that began two days prior. He was outside of the time window for IVTP and thrombectomy.   #Left  Cerebellum + Left Pontomedullary Stroke, Cryptogenic Etiology  Patient presented with the symptoms described above. At this time, stroke work up is ongoing. His MRI Brain revealed two punctate strokes to the left cerebellum and  left pontomedullary junction. Echo w/EF 60 to 65 % and LA normal in size. CTA Head and Neck was pertinent for a L V4 occlusion + stenosis of left V1 and V2. Stroke labs were completed including Lipid panel w/LDL 127 and Hemoglobin A1C 6.0. To further evaluate the posterior circulation, want to pursue a diagnostic cerebral angiogram. Stroke etiology is cryptogenic either in the setting of posterior circulation atherosclerosis versus poor collateral circulation in the setting of V4 occlusion.  -Continue DAPT for 3 months followed by Aspirin monotherapy  -Atorvastatin 40 mg for stroke prevention  -At discharge please place ambulatory referral to neurology for stroke follow up   #Hypertension He has a history of HTN and takes Clonidine + Metoprolol at home. Currently blood pressure is trending in the 170 to 200 range. He is outside of the window for permissive hypertension, primary team to gradually relax blood pressure. Reasonable for a goal of 160 to 180 at the moment. Avoid acute drops when lowering blood pressure.   #Hyperlipidemia From a stroke prevention stand point, the LDL goal is < 70. LDL is 127, transition home Simvastatin to Atorvastatin 40 this admission.   #Stroke Dysphagia Screening  Passed bedside swallow screening per nursing   #Stroke Diabetes Screening #Prediabetes  Hemoglobin A1C this admission noted to be 6.0, in the prediabetic range. Recommend management with SSI as necessary, follow up with PCP outpatient for prediabetes management.   Stroke team will continue to follow given pending stroke work up.  Hospital day # 0  Stark Jock, NP  [facility] Nurse Practitioner Patient seen and discussed with attending physician Dr. Priscille Heidelberg MD NOTE :  I have personally obtained history,examined this patient, reviewed notes, independently viewed imaging studies, participated in medical decision making and plan of care.ROS completed by me personally and pertinent  positives fully documented  I have made any additions or clarifications directly to the above note. Agree with note above.  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  Patient's neurological exam is quite disproportionate to possible occlusion and symptoms have lasted for more than 4 days and given his low NIH stroke scale we will not pursue emergent revascularization but will consider doing diagnostic catheter angiogram.  Discussed with Dr. Ouida Sills interventional neuroradiologist.  Recommend aspirin and Plavix for 3 months and aggressive risk factor modification.  Discussed with Dr. Jon Billings.  Greater than 50% time during this 35-minute visit was spent on counseling and coordination of care about his brainstem and cerebellar stroke and vertebral artery stenosis and occlusion and answered questions.  Delia Heady, MD Medical Director [facility] Pager: [phone] 03/12/2021 6:20 PM  To contact Stroke Continuity provider, please refer to [email or web address]. After hours, contact General Neurology
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
## 2021-03-12 NOTE — Consult Note (Signed)
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 1.90 cm MV Mean grad:  2.0 mmHg MV Vmax:       1.16 m/s MV Vmean:      69.8 cm/s MV Decel Time: 287 msec MV E velocity: 76.20 cm/s MV A velocity: 120.00 cm/s MV E/A ratio:  0.64 Dalton McleanMD Electronically signed by Wilfred Lacy Signature Date/Time: 03/12/2021/10:07:15 AM    Final     Labs:  CBC: Recent Labs    08/14/20 1102 09/05/20 1216 09/06/20 0337 03/11/21 1911 03/11/21 1929  WBC 10.0 12.5* 15.7* 10.2  --   HGB 14.3 14.4 13.1 15.9 17.0  HCT 44.6 44.0 40.9 48.6 50.0  PLT 209 204 185 218  --     COAGS: No results for input(s): INR, APTT in the last 8760 hours.  BMP: Recent Labs    08/14/20 1102 09/05/20 1216 09/06/20 0337 03/11/21 1911 03/11/21 1929  NA 140 140 138 135 139  K 3.5 3.8 3.9 3.5 3.5  CL 105 107 105 102 103  CO2 23 24 25 24   --   GLUCOSE 99 58* 122* 140* 135*  BUN 16 16 17 11 11   CALCIUM 9.4 9.2 9.0 9.0  --   CREATININE 0.98 0.84 0.99 0.91 0.80  GFRNONAA >60 >60 >60 >60  --     LIVER FUNCTION TESTS: Recent Labs    03/11/21 1911  BILITOT 0.9  AST 17  ALT 19  ALKPHOS 122  PROT 7.4  ALBUMIN 3.8    TUMOR MARKERS: No results for input(s):  AFPTM, CEA, CA199, CHROMGRNA in the last 8760 hours.  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   Risks and benefits of this procedure were discussed with the patient including, but not limited to bleeding, infection, vascular injury or contrast induced renal failure.  This interventional procedure involves the use of X-rays and because of the nature of the planned procedure, it is possible that we will have prolonged use of X-ray fluoroscopy.  Potential radiation risks to you include (but are not limited to) the following: - A slightly elevated risk for cancer  several years later in life. This risk is typically less than 0.5% percent. This risk is low in comparison to the normal incidence of human cancer, which is 33% for women and 50% for men according to the [employer]. - Radiation induced injury can include skin redness, resembling a rash, tissue breakdown / ulcers and hair loss (which can be temporary or permanent).   The likelihood of either of these occurring depends on the difficulty of the procedure and whether you are sensitive to radiation due to previous procedures, disease, or genetic conditions.   IF your procedure requires a prolonged use of radiation, you will be notified and given written instructions for further action.  It is your responsibility to monitor the irradiated area for the 2 weeks following the procedure and to notify your physician if you are concerned that you have suffered a radiation induced injury.    All of the patient's questions were answered, patient is agreeable to proceed. He will be NPO at midnight. AM labs ordered.   Consent signed and in IR  Thank you for this interesting consult.  I greatly enjoyed meeting Elzie Rings and look forward to participating in  their care.  A copy of this report was sent to the requesting provider on this date.  Electronically Signed: 65, AGACNP-BC [id number] 03/12/2021, 4:18 PM   I spent a total of 40 Minutes    in face to face in clinical consultation, greater than 50% of which was counseling/coordinating care for diagnostic cerebral angiogram

## 2021-03-12 NOTE — Progress Notes (Signed)
Physical Therapy Evaluation   03/12/21 0922  PT Visit Information  Last PT Received On 03/12/21  Assistance Needed +2  History of Present Illness Pt is a 70yo male presenting to [facility] ED on 8/28 complaining of headache, dizziness, and diplopia. MRI showed acute anterior left  cerebellum and nearby dorsal left brainstem PMH: HTN, CVA, s/p L TKA 09/05/20  Precautions  Precautions Fall  Restrictions  Weight Bearing Restrictions No  Home Living  Family/patient expects to be discharged to: Private residence  Living Arrangements Spouse/significant other  Available Help at Discharge Family;Available 24 hours/day;Personal care attendant  Type of Home House  Home Access Stairs to enter  Entrance Stairs-Number of Steps 2  Entrance Stairs-Rails Right  Home Layout One level  Bathroom Shower/Tub Walk-in Pension scheme manager Yes  Home Equipment [place] - single point;Walker - 4 wheels  Prior Function  Level of Independence Needs assistance  Gait / Transfers Assistance Needed Uses cane while out in the community.  ADL's / Homemaking Assistance Needed Aide comes 5x week for ~2hours per day for IADLs, light housekeeping, cooking, dressing. Pt reports IND for bathing.  Communication  Communication No difficulties  Pain Assessment  Pain Assessment No/denies pain  Cognition  Arousal/Alertness Awake/alert  Behavior During Therapy Flat affect  Overall Cognitive Status No family/caregiver present to determine baseline cognitive functioning  General Comments Pt reports that the room is shifting and "something doesn't feel right," keeps his eyes closed and covers them with his hand.  Upper Extremity Assessment  Upper Extremity Assessment RUE deficits/detail;LUE deficits/detail  RUE Coordination decreased gross motor (Pt reports increased difficulty with UE coordination R > L.)  LUE Coordination decreased gross motor  Lower Extremity Assessment  Lower Extremity  Assessment Generalized weakness  Bed Mobility  Overal bed mobility Needs Assistance  Bed Mobility Supine to Sit  Supine to sit Mod assist;+2 for physical assistance  General bed mobility comments Pt required mod assist +2 for supine to long sitting in stretcher; demonstrated L lateral heavy lean and reported that his right side was "far away." Noted continuous nystagmus when sitting upright. BP 151/98, HR 60.  Transfers  General transfer comment Pt unable to tolerate further mobility  Ambulation/Gait  General Gait Details Deferred  Modified Rankin (Stroke Patients Only)  Pre-Morbid Rankin Score 2  Modified Rankin 4  Balance  Overall balance assessment Needs assistance  Sitting-balance support Bilateral upper extremity supported  Sitting balance-Leahy Scale Poor  Sitting balance - Comments Pt reliant on UE support of PT to maintain sitting; demonstreated heavy L lateral lean and required assistance +2 to maintain upright support.  Postural control Left lateral lean  General Comments  General comments (skin integrity, edema, etc.) Noted nystagmus when lying and when sitting in long sitting position.  PT - End of Session  Activity Tolerance Treatment limited secondary to medical complications (Comment) (Pt reports increased headache, dizziness, and nausea)  Patient left in bed;with call bell/phone within reach (on stretcher in ED)  Nurse Communication Mobility status  PT Assessment  PT Recommendation/Assessment Patient needs continued PT services  PT Visit Diagnosis Other symptoms and signs involving the nervous system (R29.898);Muscle weakness (generalized) (M62.81);Dizziness and giddiness (R42)  PT Problem List Decreased knowledge of use of DME;Decreased safety awareness;Decreased cognition;Decreased coordination;Decreased mobility;Decreased balance;Decreased activity tolerance  PT Plan  PT Frequency (ACUTE ONLY) Min 4X/week  PT Treatment/Interventions (ACUTE ONLY) DME  instruction;Stair training;Functional mobility training;Therapeutic activities;Therapeutic exercise;Balance training;Neuromuscular re-education;Cognitive remediation;Patient/family education;Gait training  AM-PAC PT "6 Clicks" Mobility  Outcome Measure (Version 2)  Help needed turning from your back to your side while in a flat bed without using bedrails? 2  Help needed moving from lying on your back to sitting on the side of a flat bed without using bedrails? 1  Help needed moving to and from a bed to a chair (including a wheelchair)? 1  Help needed standing up from a chair using your arms (e.g., wheelchair or bedside chair)? 1  Help needed to walk in hospital room? 1  Help needed climbing 3-5 steps with a railing?  1  6 Click Score 7  Consider Recommendation of Discharge To: CIR/SNF/LTACH  Progressive Mobility  What is the highest level of mobility based on the progressive mobility assessment? Level 1 (Bedfast) - Unable to balance while sitting on edge of bed  Mobility Sit up in bed/chair position for meals  PT Recommendation  Follow Up Recommendations CIR  PT equipment Rolling walker with 5" wheels  Individuals Consulted  Consulted and Agree with Results and Recommendations Patient  Acute Rehab PT Goals  Patient Stated Goal to stop the room from moving  PT Goal Formulation With patient  Time For Goal Achievement 03/26/21  Potential to Achieve Goals Good  PT Time Calculation  PT Start Time (ACUTE ONLY) 9211  PT Stop Time (ACUTE ONLY) 0936  PT Time Calculation (min) (ACUTE ONLY) 18 min  PT General Charges  $$ ACUTE PT VISIT 1 Visit  PT Evaluation  $PT Eval Moderate Complexity 1 Mod  Written Expression  Dominant Hand Right   Pt presents with the impairments and problems above. Upon entry, pt reports that something is "off with his vision," with examination pt demonstrated nystagmus. UE coordination is slowed and decreased R > L. Supine to long sitting in stretcher, pt required mod  assist +2 and demonstrated heavy left lateral lean with reports of headache, dizziness, and nausea. Further mobility tasks deferred. Educated pt on visual fixation and focus to help with symptoms, pt verbalized understanding. Recommending CIR-level therapies upon discharge. We will continue to follow the pt acutely to promote independence with functional mobility.   Johnn Hai, SPT

## 2021-03-12 NOTE — Progress Notes (Signed)
*  PRELIMINARY RESULTS* Echocardiogram 2D Echocardiogram has been performed.  Neomia Dear RDCS 03/12/2021, 9:32 AM

## 2021-03-13 ENCOUNTER — Inpatient Hospital Stay (HOSPITAL_COMMUNITY): Payer: Medicare Other

## 2021-03-13 HISTORY — PX: IR ANGIO INTRA EXTRACRAN SEL INTERNAL CAROTID BILAT MOD SED: IMG5363

## 2021-03-13 HISTORY — PX: IR US GUIDE VASC ACCESS RIGHT: IMG2390

## 2021-03-13 HISTORY — PX: IR ANGIO VERTEBRAL SEL VERTEBRAL BILAT MOD SED: IMG5369

## 2021-03-13 LAB — CBC WITH DIFFERENTIAL/PLATELET
Abs Immature Granulocytes: 0.06 10*3/uL (ref 0.00–0.07)
Basophils Absolute: 0 10*3/uL (ref 0.0–0.1)
Basophils Relative: 0 %
Eosinophils Absolute: 0 10*3/uL (ref 0.0–0.5)
Eosinophils Relative: 0 %
HCT: 45.4 % (ref 39.0–52.0)
Hemoglobin: 14.9 g/dL (ref 13.0–17.0)
Immature Granulocytes: 1 %
Lymphocytes Relative: 14 %
Lymphs Abs: 1.7 10*3/uL (ref 0.7–4.0)
MCH: 28.2 pg (ref 26.0–34.0)
MCHC: 32.8 g/dL (ref 30.0–36.0)
MCV: 85.8 fL (ref 80.0–100.0)
Monocytes Absolute: 0.9 10*3/uL (ref 0.1–1.0)
Monocytes Relative: 7 %
Neutro Abs: 9.3 10*3/uL — ABNORMAL HIGH (ref 1.7–7.7)
Neutrophils Relative %: 78 %
Platelets: 197 10*3/uL (ref 150–400)
RBC: 5.29 MIL/uL (ref 4.22–5.81)
RDW: 14.2 % (ref 11.5–15.5)
WBC: 12 10*3/uL — ABNORMAL HIGH (ref 4.0–10.5)
nRBC: 0 % (ref 0.0–0.2)

## 2021-03-13 LAB — BASIC METABOLIC PANEL
Anion gap: 10 (ref 5–15)
BUN: 13 mg/dL (ref 8–23)
CO2: 23 mmol/L (ref 22–32)
Calcium: 9.3 mg/dL (ref 8.9–10.3)
Chloride: 102 mmol/L (ref 98–111)
Creatinine, Ser: 0.93 mg/dL (ref 0.61–1.24)
GFR, Estimated: >60 mL/min (ref >60)
Glucose, Bld: 104 mg/dL — ABNORMAL HIGH (ref 70–99)
Potassium: 3.5 mmol/L (ref 3.5–5.1)
Sodium: 135 mmol/L (ref 135–145)

## 2021-03-13 LAB — PROTIME-INR
INR: 1.1 (ref 0.8–1.2)
Prothrombin Time: 13.9 seconds (ref 11.4–15.2)

## 2021-03-13 MED ORDER — CLONIDINE HCL 0.1 MG PO TABS
0.1000 mg | ORAL_TABLET | Freq: Every day | ORAL | Status: DC
  Administered 2021-03-14: 0.1 mg via ORAL
  Filled 2021-03-13: qty 1

## 2021-03-13 MED ORDER — MIDAZOLAM HCL 2 MG/2ML IJ SOLN
INTRAMUSCULAR | Status: AC | PRN
  Administered 2021-03-13: 1 mg via INTRAVENOUS

## 2021-03-13 MED ORDER — MIDAZOLAM HCL 2 MG/2ML IJ SOLN
INTRAMUSCULAR | Status: AC
  Filled 2021-03-13: qty 2

## 2021-03-13 MED ORDER — HYDRALAZINE HCL 20 MG/ML IJ SOLN
INTRAMUSCULAR | Status: AC | PRN
  Administered 2021-03-13: 10 mg via INTRAVENOUS

## 2021-03-13 MED ORDER — CLONIDINE HCL 0.1 MG PO TABS: 0.1000 mg | ORAL_TABLET | Freq: Every day | ORAL | Status: DC

## 2021-03-13 MED ORDER — IOHEXOL 240 MG/ML SOLN
50.0000 mL | Freq: Once | INTRAMUSCULAR | Status: AC | PRN
  Administered 2021-03-13: 18 mL via INTRAVENOUS

## 2021-03-13 MED ORDER — VERAPAMIL HCL 2.5 MG/ML IV SOLN
INTRAVENOUS | Status: AC
  Filled 2021-03-13: qty 2

## 2021-03-13 MED ORDER — HYDRALAZINE HCL 20 MG/ML IJ SOLN
INTRAMUSCULAR | Status: AC
  Filled 2021-03-13: qty 1

## 2021-03-13 MED ORDER — MORPHINE SULFATE (PF) 2 MG/ML IV SOLN
0.5000 mg | Freq: Once | INTRAVENOUS | Status: AC
Start: 2021-03-13 — End: 2021-03-13
  Administered 2021-03-13: 0.5 mg via INTRAVENOUS
  Filled 2021-03-13: qty 1

## 2021-03-13 MED ORDER — LIDOCAINE HCL (PF) 1 % IJ SOLN
INTRAMUSCULAR | Status: AC | PRN
Start: 2021-03-13 — End: 2021-03-13
  Administered 2021-03-13: 10 mL

## 2021-03-13 MED ORDER — VERAPAMIL HCL 2.5 MG/ML IV SOLN
INTRAVENOUS | Status: AC | PRN
  Administered 2021-03-13: 5 mg via INTRA_ARTERIAL

## 2021-03-13 MED ORDER — LIDOCAINE HCL 1 % IJ SOLN
INTRAMUSCULAR | Status: AC
  Filled 2021-03-13: qty 20

## 2021-03-13 MED ORDER — FENTANYL CITRATE (PF) 100 MCG/2ML IJ SOLN
INTRAMUSCULAR | Status: AC
  Filled 2021-03-13: qty 2

## 2021-03-13 MED ORDER — FENTANYL CITRATE (PF) 100 MCG/2ML IJ SOLN
INTRAMUSCULAR | Status: AC | PRN
  Administered 2021-03-13: 25 ug via INTRAVENOUS

## 2021-03-13 NOTE — Progress Notes (Signed)
Checking pt pulses and groin area every 15 min for first hour.

## 2021-03-13 NOTE — Progress Notes (Addendum)
STROKE TEAM PROGRESS NOTE    Interval History   No acute events overnight, patient is resting in bed with no family at bedside.  He just returned from diagnostic cerebral catheter angiogram which shows severe occlusive posterior circulation disease with high-grade stenosis of the right V1-V2 segment and occlusion of the left V4 segment just distal to the PICA there is preserved flow in the distal two thirds of the basilar artery via posterior cerebral artery and posterior communicating arteries.  Pertinent Lab Work and Imaging    03/11/21 CT Head WO IV Contrast There is no mass, hemorrhage or extra-axial collection. There is generalized atrophy without lobar predilection. There is an old left parietal infarct. There is hypoattenuation of the periventricular white matter, most commonly indicating chronic ischemic microangiopathy.  03/11/21 CT Angio Head and Neck W WO IV Contrast 1. 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 These findings are age indeterminate. 2. 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 3. 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 4. 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  03/12/21 MRI Brain WO IV Contrast 1. Two punctate acute infarcts are identified in the anterior left cerebellum and nearby dorsal left brainstem. No associated hemorrhage or mass effect.   2. Evidence of the poor flow in the dominant distal Left Vertebral Artery and proximal Basilar Artery as seen by CTA yesterday.   3. 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 Chronic posterior left MCA territory ischemia and encephalomalacia,some of which was acute in 2012. And progressed, advanced chronic small vessel disease in the thalami and bilateral cerebral white matter since that  time.  03/12/21 Echocardiogram Complete   1. Left ventricular ejection fraction, by estimation, is 60 to 65%. The  left ventricle has normal function. The left ventricle has no regional  wall motion abnormalities. There is mild left ventricular hypertrophy.  Left ventricular diastolic parameters  are consistent with Grade I diastolic dysfunction (impaired relaxation).   2. Right ventricular systolic function is normal. The right ventricular  size is normal. Tricuspid regurgitation signal is inadequate for assessing  PA pressure.   3. The mitral valve is normal in structure. Trivial mitral valve  regurgitation. No evidence of mitral stenosis.   4. The aortic valve is tricuspid. Aortic valve regurgitation is not  visualized. Mild aortic valve sclerosis is present, with no evidence of  aortic valve stenosis.   5. The inferior vena cava is normal in size with greater than 50%  respiratory variability, suggesting right atrial pressure of 3 mmHg.   Cerebral catheter angiogram 03/13/21 : Severe atherosclerotic disease of the posterior circulation with high-grade stenosis of the right V1-V2/vertebral artery segment an occlusion of the left vertebral artery at the V4 segment, just distal to the PICA origin.  A filling defect is seen in the proximal vertebral artery with delayed anterograde flow.  There is brisk contrast opacification of the distal 2/3 of the basilar artery and posterior cerebral artery via bilateral posterior communicating arteries.  No significant atherosclerotic disease of the anterior circulation.  Physical Examination   Constitutional: Calm, appropriate for condition  Cardiovascular: Normal RR Respiratory: No increased WOB   Mental status: AAOx4, following commands  Speech: Fluent, repetition and naming intact  Cranial nerves: EOMI with mild left esotropia and horizontal nystagmus bilateral end gaze., VFF, Face symmetric, Tongue midline  Motor: Normal  bulk and tone. No drift.  Antigravity throughout  Sensory: Intact to light touch throughout  Coordination: Mild ataxia to the LUE  Gait: Deferred   Assessment and Plan   Carl Gilbert is a 70 y.o. male w/pmh of  HLD, HTN, prior stroke and arthritis who presents with dizziness, light headedness and headache that began two days prior. He was outside of the time window for IVTP and thrombectomy.   #Left Cerebellum + Left Pontomedullary Stroke, likely small vessel disease versus failure of collateral circulation in the setting of severe occlusive posterior circulation disease Patient presented with the symptoms described above. At this time, stroke work up is ongoing. His MRI Brain revealed two punctate strokes to the left cerebellum and left pontomedullary junction. Echo w/EF 60 to 65 % and LA normal in size. CTA Head and Neck was pertinent for a L V4 occlusion + stenosis of left V1 and V2. Stroke labs were completed including Lipid panel w/LDL 127 and Hemoglobin A1C 6.0. To further evaluate the posterior circulation, want to pursue a diagnostic cerebral angiogram. Stroke etiology is cryptogenic either in the setting of posterior circulation atherosclerosis versus poor collateral circulation in the setting of V4 occlusion.  -Continue DAPT for 3 months followed by Aspirin monotherapy  -Atorvastatin 40 mg for stroke prevention  -At discharge please place ambulatory referral to neurology for stroke follow up   #Hypertension He has a history of HTN and takes Clonidine + Metoprolol at home. Currently blood pressure is trending in the 170 to 200 range. He is outside of the window for permissive hypertension, primary team to gradually relax blood pressure. Reasonable for a goal of 160 to 180 at the moment. Avoid acute drops when lowering blood pressure.   #Hyperlipidemia From a stroke prevention stand point, the LDL goal is < 70. LDL is 127, transition home Simvastatin to Atorvastatin 40 this admission.   #Stroke Dysphagia  Screening  Passed bedside swallow screening per nursing   #Stroke Diabetes Screening #Prediabetes  Hemoglobin A1C this admission noted to be 6.0, in the prediabetic range. Recommend management with SSI as necessary, follow up with PCP outpatient for prediabetes management.     Patient presented with dizziness and gait ataxia and MRI scan shows small left cerebellar and brainstem strokes and CT angiogram as well as diagnostic cerebral catheter angiogram shows severe occlusive posterior circulation disease but basilar artery seems to feel well in the upper two thirds from anterior circulation patient's neurological exam is quite disproportionate to possible occlusion and symptoms have lasted for more than 4 days and given his low NIH stroke scale we will not pursue emergent revascularization but will do aggressive risk factor modification and treatment with dual antiplatelet therapy for 3 months.  Discussed with Dr. Tommi Rumps. Dorice Lamas interventional neuroradiologist.  And Dr. Jon Billings.  Recommend aspirin and Plavix for 3 months and aggressive risk factor modification.  .  Greater than 50% time during this 35-minute visit was spent on counseling and coordination of care about his brainstem and cerebellar stroke and vertebral artery stenosis and occlusion and answered questions.  Continue mobilization out of bed and ongoing therapy consults and transfer to inpatient rehab when bed available.  Follow-up as outpatient stroke clinic in 2 months.  Stroke team will sign off.  Kindly call for questions  Delia Heady, MD Medical Director [facility] Pager: [phone] 03/13/2021 12:03 PM  To contact Stroke Continuity provider, please refer to [email or web address]. After hours, contact General Neurology

## 2021-03-13 NOTE — Progress Notes (Signed)
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 inferior vena cava is normal in size with greater than 50% respiratory variability, suggesting right atrial pressure of 3 mmHg. FINDINGS  Left Ventricle: Left ventricular ejection fraction, by estimation, is 60 to 65%. The left ventricle has normal function. The left ventricle has no regional wall motion abnormalities. The left ventricular internal cavity size was normal in size. There is  mild left ventricular hypertrophy. Left ventricular diastolic parameters are consistent with Grade I diastolic dysfunction (impaired relaxation). Right Ventricle: The right ventricular size is normal. No increase in right ventricular wall thickness. Right ventricular systolic function is normal. Tricuspid regurgitation signal is inadequate for assessing PA pressure. Left Atrium: Left atrial size was normal in size. Right Atrium: Right atrial size was normal in size. Pericardium: Trivial pericardial effusion is present. Mitral Valve: The mitral valve is normal in structure. Trivial mitral valve regurgitation. No  evidence of mitral valve stenosis. MV peak gradient, 5.4 mmHg. The mean mitral valve gradient is 2.0 mmHg. Tricuspid Valve: The tricuspid valve is normal in structure. Tricuspid valve regurgitation is not demonstrated. Aortic Valve: The aortic valve is tricuspid. Aortic valve regurgitation is not visualized. Mild aortic valve sclerosis is present, with no evidence of aortic valve stenosis. Aortic valve mean gradient measures 4.0 mmHg. Aortic valve peak gradient measures 6.2 mmHg. Aortic valve area, by VTI measures 2.66 cm. Pulmonic Valve: The pulmonic valve was normal in structure. Pulmonic valve regurgitation is trivial. Aorta: The aortic root is normal in size and structure. Venous: The inferior vena cava is normal in size with greater than 50% respiratory variability, suggesting right atrial pressure of 3 mmHg. IAS/Shunts: No atrial level shunt detected by color flow Doppler.  LEFT VENTRICLE PLAX 2D LVIDd:         4.10 cm     Diastology LVIDs:         2.20 cm     LV e' medial:    3.70 cm/s LV PW:         1.40 cm     LV E/e' medial:  20.6 LV IVS:        1.30 cm     LV e' lateral:   7.29 cm/s LVOT diam:     1.90 cm     LV E/e' lateral: 10.5 LV SV:         75 LV SV Index:   38 LVOT Area:     2.84 cm  LV Volumes (  MOD) LV vol d, MOD A2C: 27.9 ml LV vol d, MOD A4C: 44.4 ml LV vol s, MOD A2C: 6.2 ml LV vol s, MOD A4C: 23.5 ml LV SV MOD A2C:     21.7 ml LV SV MOD A4C:     44.4 ml LV SV MOD BP:      24.8 ml RIGHT VENTRICLE RV Basal diam:  2.30 cm RV Mid diam:    1.30 cm TAPSE (M-mode): 2.2 cm LEFT ATRIUM             Index       RIGHT ATRIUM          Index LA Vol (A2C):   34.9 ml 17.68 ml/m RA Area:     9.61 cm LA Vol (A4C):   44.6 ml 22.58 ml/m RA Volume:   20.40 ml 10.33 ml/m LA Biplane Vol: 47.7 ml 24.15 ml/m  AORTIC VALVE                   PULMONIC VALVE AV Area (Vmax):    2.52 cm    PV Vmax:          0.92 m/s AV Area (Vmean):   2.39 cm    PV Vmean:         69.700 cm/s AV Area (VTI):     2.66 cm    PV VTI:            0.247 m AV Vmax:           125.00 cm/s PV Peak grad:     3.4 mmHg AV Vmean:          90.200 cm/s PV Mean grad:     2.0 mmHg AV VTI:            0.284 m     PR End Diast Vel: 5.86 msec AV Peak Grad:      6.2 mmHg AV Mean Grad:      4.0 mmHg LVOT Vmax:         111.00 cm/s LVOT Vmean:        75.900 cm/s LVOT VTI:          0.266 m LVOT/AV VTI ratio: 0.94  AORTA Ao Root diam: 3.70 cm Ao Asc diam:  2.90 cm MITRAL VALVE MV Area (PHT): 2.64 cm     SHUNTS MV Area VTI:   2.18 cm     Systemic VTI:  0.27 m MV Peak grad:  5.4 mmHg     Systemic Diam: 1.90 cm MV Mean grad:  2.0 mmHg MV Vmax:       1.16 m/s MV Vmean:      69.8 cm/s MV Decel Time: 287 msec MV E velocity: 76.20 cm/s MV A velocity: 120.00 cm/s MV E/A ratio:  0.64 Dalton McleanMD Electronically signed by Wilfred Lacy Signature Date/Time: 03/12/2021/10:07:15 AM    Final         Scheduled Meds:   stroke: mapping our early stages of recovery book   Does not apply Once   aspirin EC  81 mg Oral Daily   atorvastatin  40 mg Oral QHS   clopidogrel  75 mg Oral Daily   lidocaine       Continuous Infusions:   LOS: 1 day    Time spent: 35 minutes.     Alba Cory, MD [facility]   If 7PM-7AM, please contact night-coverage [email or web address]  03/13/2021, 2:36 PM

## 2021-03-13 NOTE — Progress Notes (Signed)
OT Cancellation Note  Patient Details Name: Carl Gilbert MRN: [id number] DOB: March 19, 1951   Cancelled Treatment:    Reason Eval/Treat Not Completed: Medical issues which prohibited therapy;Active bedrest order (s/p IR)  Louine Tenpenny,[name] Luisa Dago, OT/L   Acute OT Clinical Specialist Acute Rehabilitation Services Pager [phone] Office [phone]  03/13/2021, 12:41 PM

## 2021-03-13 NOTE — Sedation Documentation (Signed)
5 fr Exoseal deployed at 0950. Right groin site level zero, pulses dopplered.  Handoff with bedside RNs Corrie Dandy and Bath) with site reviewed and pulses dopplered.

## 2021-03-13 NOTE — Progress Notes (Addendum)
PT Cancellation Note  Patient Details Name: Carl Gilbert MRN: [id number] DOB: 04/28/1951   Cancelled Treatment:    Reason Eval/Treat Not Completed: Patient at procedure or test/unavailable. Pt upon return with groin sheath and bedrest. Will attempt next date.   Sabiha Sura B Joaquim Tolen 03/13/2021, 10:07 AM Merryl Hacker, PT Acute Rehabilitation Services Pager: [phone] Office: [phone]

## 2021-03-13 NOTE — Progress Notes (Addendum)
Inpatient Rehabilitation Admissions Coordinator   Inpatient rehab consult received. I met at bedside with patient and 2 of his 4 sisters, Kandis Fantasia being one. I discussed goals and expectations of a possible Cir admit pending his therapy progress and medical workup completion. His girlfriend works during the day and sisters feel Cir will be needed prior to d/c home. I will follow up tomorrow. I will need updated therapy treatment notes to pursue Auth with [facility].  Danne Baxter, RN, MSN Rehab Admissions Coordinator [phone] 03/13/2021 12:03 PM

## 2021-03-13 NOTE — Procedures (Addendum)
INTERVENTIONAL NEURORADIOLOGY BRIEF POSTPROCEDURE NOTE   Diagnostic cerebral angiogram    Attending: Dr. 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   Complications: None    Estimated blood loss: Negligible    Specimen: None    Findings: Severe atherosclerotic disease of the posterior circulation with high-grade stenosis of the right V1-V2/vertebral artery segment an occlusion of the left vertebral artery at the V4 segment, just distal to the PICA origin.  A filling defect is seen in the proximal basilar artery with high grade stenosis and delayed anterograde flow.  There is brisk contrast opacification of the distal 2/3 of the basilar artery and posterior cerebral artery via bilateral posterior communicating arteries.  No significant atherosclerotic disease of the anterior circulation.    The patient tolerated the procedure well without incident or complication and is in stable condition.  Please see detailed report on [employer].

## 2021-03-14 LAB — BASIC METABOLIC PANEL
Anion gap: 9 (ref 5–15)
BUN: 14 mg/dL (ref 8–23)
CO2: 23 mmol/L (ref 22–32)
Calcium: 8.9 mg/dL (ref 8.9–10.3)
Chloride: 103 mmol/L (ref 98–111)
Creatinine, Ser: 1.04 mg/dL (ref 0.61–1.24)
GFR, Estimated: >60 mL/min (ref >60)
Glucose, Bld: 98 mg/dL (ref 70–99)
Potassium: 3.1 mmol/L — ABNORMAL LOW (ref 3.5–5.1)
Sodium: 135 mmol/L (ref 135–145)

## 2021-03-14 LAB — CBC
HCT: 44.3 % (ref 39.0–52.0)
Hemoglobin: 15 g/dL (ref 13.0–17.0)
MCH: 28.9 pg (ref 26.0–34.0)
MCHC: 33.9 g/dL (ref 30.0–36.0)
MCV: 85.4 fL (ref 80.0–100.0)
Platelets: 196 10*3/uL (ref 150–400)
RBC: 5.19 MIL/uL (ref 4.22–5.81)
RDW: 14.3 % (ref 11.5–15.5)
WBC: 10.7 10*3/uL — ABNORMAL HIGH (ref 4.0–10.5)
nRBC: 0 % (ref 0.0–0.2)

## 2021-03-14 LAB — MAGNESIUM: Magnesium: 2.3 mg/dL (ref 1.7–2.4)

## 2021-03-14 MED ORDER — POTASSIUM CHLORIDE CRYS ER 20 MEQ PO TBCR
40.0000 meq | EXTENDED_RELEASE_TABLET | ORAL | Status: AC
  Administered 2021-03-14 (×2): 40 meq via ORAL
  Filled 2021-03-14 (×2): qty 2

## 2021-03-14 MED ORDER — ASPIRIN 81 MG PO TBEC: 81.0000 mg | DELAYED_RELEASE_TABLET | Freq: Every day | ORAL | 11 refills | Status: AC

## 2021-03-14 MED ORDER — ATORVASTATIN CALCIUM 40 MG PO TABS: 40.0000 mg | ORAL_TABLET | Freq: Every day | ORAL | 1 refills | Status: AC

## 2021-03-14 MED ORDER — CLONIDINE HCL 0.1 MG PO TABS: 0.1000 mg | ORAL_TABLET | Freq: Every day | ORAL | 11 refills | Status: AC

## 2021-03-14 MED ORDER — CLOPIDOGREL BISULFATE 75 MG PO TABS: 75.0000 mg | ORAL_TABLET | Freq: Every day | ORAL | 2 refills | Status: AC

## 2021-03-14 NOTE — TOC Transition Note (Signed)
Transition of Care [facility]) - CM/SW Discharge Note   Patient Details  Name: Nethaniel Mattie MRN: [id number] Date of Birth: Feb 13, 1951  [facility]) CM/SW Contact:  Lorri Frederick, LCSW Phone Number: 03/14/2021, 1:54 PM   Clinical Narrative:  Pt discharging home with [facility].  No DME needs per pt.  Pt significant other, Drinda Butts, will transport home, per pt.  No other needs identified.       Final next level of care: Home w Home Health Services Barriers to Discharge: No Barriers Identified   Patient Goals and CMS Choice Patient states their goals for this hospitalization and ongoing recovery are:: "10 out ot 10" CMS Medicare.gov Compare Post Acute Care list provided to:: Patient Choice offered to / list presented to : Patient  Discharge Placement                       Discharge Plan and Services     Post Acute Care Choice: Home Health          DME Arranged: N/A         HH Arranged: PT, OT HH Agency: [facility] Date [facility] Agency Contacted: 03/14/21 Time HH Agency Contacted: 1205 Representative spoke with at [facility] Agency: Kandee Keen  Social Determinants of Health (SDOH) Interventions     Readmission Risk Interventions No flowsheet data found.
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
## 2021-03-14 NOTE — Plan of Care (Signed)
  Problem: Education: Goal: Knowledge of disease or condition will improve Outcome: Completed/Met Goal: Knowledge of secondary prevention will improve Outcome: Completed/Met Goal: Knowledge of patient specific risk factors addressed and post discharge goals established will improve Outcome: Completed/Met Goal: Individualized Educational Video(s) Outcome: Completed/Met   Problem: Coping: Goal: Will verbalize positive feelings about self Outcome: Completed/Met Goal: Will identify appropriate support needs Outcome: Completed/Met   Problem: Health Behavior/Discharge Planning: Goal: Ability to manage health-related needs will improve Outcome: Completed/Met   Problem: Self-Care: Goal: Ability to participate in self-care as condition permits will improve Outcome: Completed/Met Goal: Verbalization of feelings and concerns over difficulty with self-care will improve Outcome: Completed/Met Goal: Ability to communicate needs accurately will improve Outcome: Completed/Met   Problem: Nutrition: Goal: Risk of aspiration will decrease Outcome: Completed/Met Goal: Dietary intake will improve Outcome: Completed/Met   Problem: Ischemic Stroke/TIA Tissue Perfusion: Goal: Complications of ischemic stroke/TIA will be minimized Outcome: Completed/Met
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
## 2021-03-14 NOTE — Progress Notes (Signed)
Inpatient Rehabilitation Admissions Coordinator   Patient has progressed to be able to go home with [facility] per recommendations. He has aide 5 days per week from 10 am until 12 noon. Drinda Butts is at work 6 am until 2:30pm. I spoke with his sister, Dorothy Puffer by phone to explain that he has progressed to be able to go home . She in agreement. I have alerted acute team, TOC. We will sign off at this time.  Carl Glazier, RN, MSN Rehab Admissions Coordinator [phone] 03/14/2021 11:11 AM
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
## 2021-03-14 NOTE — Discharge Summary (Signed)
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 Frontal and lateral angiograms of the neck were obtained.  Under biplane roadmap, the catheter was advanced into the left internal carotid artery. Frontal and angiograms of the head were obtained. The catheter was subsequently withdrawn. A right common femoral artery angiogram was obtained in right anterior oblique view. A 5 French Angio-Seal was utilized for access closure. Immediate hemostasis was achieved. FINDINGS: Right CCA angiograms: Cervical angiograms show mild atherosclerotic changes of the right carotid bulb without hemodynamically significant stenosis. Right ICA angiograms: Brisk vascular contrast filling of the right ACA and MCA vascular trees. There is also brisk contrast opacification of the basilar artery and bilateral posterior cerebral arteries via right posterior communicating artery. Filling defect with severe stenosis is noted in the proximal basilar artery and moderate stenosis is noted at the mid basilar artery. No aneurysms or abnormally high-flow, early  draining veins are seen. No regions of abnormal hypervascularity are noted. The visualized dural sinuses are patent. Right subclavian angiograms: Normal caliber of the right subclavian artery. Diffuse small caliber of the right vertebral artery with focal area of severe stenosis at the V1-V2 junction moderate stenosis at the mid V2 segment. Right vertebral artery angiograms: Moderate stenosis of the intracranial right vertebral artery distal to the origin of the right PICA with filling defect noted within the proximal basilar artery resulting in severe stenosis with delayed distal opacification. Luminal irregularity of the basilar artery, consistent with intracranial atherosclerotic disease. There is rapid washout of the distal basilar artery consistent with retrograde flow. No aneurysms or abnormally high-flow, early draining veins are seen. No regions of abnormal hypervascularity are noted. The visualized dural sinuses are patent. Left subclavian angiograms: Normal caliber of the left subclavian artery. Diffuse atherosclerotic disease is noted of the cervical left vertebral artery with multifocal areas of mild to moderate stenosis. Left vertebral artery angiograms: Occlusion of the intracranial left vertebral artery distal to the PICA origin with luminal irregularity of the proximal segment, consistent with atherosclerotic disease. Left CCA angiograms: Mild atherosclerotic changes of the left carotid bifurcation without hemodynamically significant stenosis. Left ICA angiograms: Brisk vascular contrast filling of the left ACA and MCA vascular trees. There is also brisk contrast opacification of the basilar artery and bilateral posterior cerebral arteries via left posterior communicating artery. Filling defect with severe stenosis is noted in the proximal basilar artery and moderate stenosis is noted at the mid basilar artery. No aneurysms or abnormally high-flow, early draining veins are seen. No regions of  abnormal hypervascularity are noted. The visualized dural sinuses are patent. Right common femoral artery angiogram and ultrasound: Patent right common femoral artery. The access is at the level of the mid right common femoral artery. Atherosclerotic changes are noted along the right common femoral artery without hemodynamically significant stenosis, with adequate caliber for closure device. PROCEDURE: No intervention performed. IMPRESSION: 1. 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 Predominantly retrograde flow to the basilar artery via bilateral posterior communicating arteries. Focal area of moderate stenosis is also seen in the mid basilar artery related to atherosclerotic disease. 2. Atherosclerotic changes of the dominant left vertebral artery with multifocal areas of mild-to-moderate stenosis in the cervical segment and occlusion of the intracranial segment distal to the left PICA origin. 3. Atherosclerotic changes of the non dominant right vertebral artery with focal area of severe stenosis in the proximal cervical segment and moderate stenosis in the distal intracranial segment. 4. Mild atherosclerotic changes of the bilateral carotid bifurcations without hemodynamically significant stenosis. 5. No significant anterior circulation intracranial atherosclerotic  disease. PLAN: Case discussed with Dr. Leonie Man. Best medical management recommended. Cervical and intracranial angioplasty and stenting may be considered in case of treatment failure. Electronically Signed   By: Pedro Earls M.D.   On: 03/13/2021 14:42   ECHOCARDIOGRAM COMPLETE  Result Date: 03/12/2021    ECHOCARDIOGRAM REPORT   Patient Name:   BELINDA BRINGHURST Date of Exam: 03/12/2021 Medical Rec #:  [id number]       Height:       69.0 in Accession #:    [id number]      Weight:       180.0 lb Date of Birth:  Feb 07, 1951      BSA:           1.976 m Patient Age:    84 years        BP:           181/93 mmHg Patient Gender: M               HR:           60 bpm. Exam Location:  Inpatient Procedure: 2D Echo, Cardiac Doppler and Color Doppler Indications:    CVA  History:        Patient has prior history of Echocardiogram examinations, most                 recent 06/06/2011. Angina, Stroke, Signs/Symptoms:Dyspnea; Risk                 Factors:Hypertension, Dyslipidemia and Diabetes. 06/11/2011 TEE.  Sonographer:    [place] Referring Phys: [id number] [place]  1. Left ventricular ejection fraction, by estimation, is 60 to 65%. The left ventricle has normal function. The left ventricle has no regional wall motion abnormalities. There is mild left ventricular hypertrophy. Left ventricular diastolic parameters are consistent with Grade I diastolic dysfunction (impaired relaxation).  2. Right ventricular systolic function is normal. The right ventricular size is normal. Tricuspid regurgitation signal is inadequate for assessing PA pressure.  3. The mitral valve is normal in structure. Trivial mitral valve regurgitation. No evidence of mitral stenosis.  4. The aortic valve is tricuspid. Aortic valve regurgitation is not visualized. Mild aortic valve sclerosis is present, with no evidence of aortic valve stenosis.  5. The inferior vena cava is normal in size with greater than 50% respiratory variability, suggesting right atrial pressure of 3 mmHg. FINDINGS  Left Ventricle: Left ventricular ejection fraction, by estimation, is 60 to 65%. The left ventricle has normal function. The left ventricle has no regional wall motion abnormalities. The left ventricular internal cavity size was normal in size. There is  mild left ventricular hypertrophy. Left ventricular diastolic parameters are consistent with Grade I diastolic dysfunction (impaired relaxation). Right Ventricle: The right ventricular size is normal. No increase in right  ventricular wall thickness. Right ventricular systolic function is normal. Tricuspid regurgitation signal is inadequate for assessing PA pressure. Left Atrium: Left atrial size was normal in size. Right Atrium: Right atrial size was normal in size. Pericardium: Trivial pericardial effusion is present. Mitral Valve: The mitral valve is normal in structure. Trivial mitral valve regurgitation. No evidence of mitral valve stenosis. MV peak gradient, 5.4 mmHg. The mean mitral valve gradient is 2.0 mmHg. Tricuspid Valve: The tricuspid valve is normal in structure. Tricuspid valve regurgitation is not demonstrated. Aortic Valve: The aortic valve is tricuspid. Aortic valve regurgitation is not visualized. Mild aortic valve sclerosis is present, with no evidence of aortic  valve stenosis. Aortic valve mean gradient measures 4.0 mmHg. Aortic valve peak gradient measures 6.2 mmHg. Aortic valve area, by VTI measures 2.66 cm. Pulmonic Valve: The pulmonic valve was normal in structure. Pulmonic valve regurgitation is trivial. Aorta: The aortic root is normal in size and structure. Venous: The inferior vena cava is normal in size with greater than 50% respiratory variability, suggesting right atrial pressure of 3 mmHg. IAS/Shunts: No atrial level shunt detected by color flow Doppler.  LEFT VENTRICLE PLAX 2D LVIDd:         4.10 cm     Diastology LVIDs:         2.20 cm     LV e' medial:    3.70 cm/s LV PW:         1.40 cm     LV E/e' medial:  20.6 LV IVS:        1.30 cm     LV e' lateral:   7.29 cm/s LVOT diam:     1.90 cm     LV E/e' lateral: 10.5 LV SV:         75 LV SV Index:   38 LVOT Area:     2.84 cm  LV Volumes (MOD) LV vol d, MOD A2C: 27.9 ml LV vol d, MOD A4C: 44.4 ml LV vol s, MOD A2C: 6.2 ml LV vol s, MOD A4C: 23.5 ml LV SV MOD A2C:     21.7 ml LV SV MOD A4C:     44.4 ml LV SV MOD BP:      24.8 ml RIGHT VENTRICLE RV Basal diam:  2.30 cm RV Mid diam:    1.30 cm TAPSE (M-mode): 2.2 cm LEFT ATRIUM             Index        RIGHT ATRIUM          Index LA Vol (A2C):   34.9 ml 17.68 ml/m RA Area:     9.61 cm LA Vol (A4C):   44.6 ml 22.58 ml/m RA Volume:   20.40 ml 10.33 ml/m LA Biplane Vol: 47.7 ml 24.15 ml/m  AORTIC VALVE                   PULMONIC VALVE AV Area (Vmax):    2.52 cm    PV Vmax:          0.92 m/s AV Area (Vmean):   2.39 cm    PV Vmean:         69.700 cm/s AV Area (VTI):     2.66 cm    PV VTI:           0.247 m AV Vmax:           125.00 cm/s PV Peak grad:     3.4 mmHg AV Vmean:          90.200 cm/s PV Mean grad:     2.0 mmHg AV VTI:            0.284 m     PR End Diast Vel: 5.86 msec AV Peak Grad:      6.2 mmHg AV Mean Grad:      4.0 mmHg LVOT Vmax:         111.00 cm/s LVOT Vmean:        75.900 cm/s LVOT VTI:          0.266 m LVOT/AV VTI ratio: 0.94  AORTA Ao Root diam: 3.70 cm Ao Asc  diam:  2.90 cm MITRAL VALVE MV Area (PHT): 2.64 cm     SHUNTS MV Area VTI:   2.18 cm     Systemic VTI:  0.27 m MV Peak grad:  5.4 mmHg     Systemic Diam: 1.90 cm MV Mean grad:  2.0 mmHg MV Vmax:       1.16 m/s MV Vmean:      69.8 cm/s MV Decel Time: 287 msec MV E velocity: 76.20 cm/s MV A velocity: 120.00 cm/s MV E/A ratio:  0.64 [place] Electronically signed by Franki Monte Signature Date/Time: 03/12/2021/10:07:15 AM    Final    IR ANGIO INTRA EXTRACRAN SEL INTERNAL CAROTID BILAT MOD SED  Result Date: 03/13/2021 INDICATION: Kharson Rasmusson is a 70 y.o. male with a medical history significant for HTN and prior stroke. He presented to the [facility] ED 03/11/21 with complaints of high blood pressure, dizziness and headache. 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 Multifocal moderate-to-severe- stenosis of the left vertebral artery V1 and V2 segements also noted. A diagnostic cerebral angiogram was requested to better evaluate CT angiogram  findings. EXAM: ULTRASOUND-GUIDED VASCULAR ACCESS DIAGNOSTIC CEREBRAL ANGIOGRAM COMPARISON:  CT angiogram of the head and neck March 11, 2021. MEDICATIONS: 10 mg of hydralazine IV; 5 mg of verapamil IA. ANESTHESIA/SEDATION: Versed 1.5 mg IV; Fentanyl 25 mcg IV Moderate Sedation Time:  50 minutes The patient was continuously monitored during the procedure by the interventional radiology nurse under my direct supervision. CONTRAST:  54 mL of Omnipaque 240 milligram/mL FLUOROSCOPY TIME:  Fluoroscopy Time: 11 minutes 24 seconds (277 mGy). COMPLICATIONS: None immediate. TECHNIQUE: Informed written consent was obtained from the patient after a thorough discussion of the procedural risks, benefits and alternatives. All questions were addressed. Maximal Sterile Barrier Technique was utilized including caps, mask, sterile gowns, sterile gloves, sterile drape, hand hygiene and skin antiseptic. A timeout was performed prior to the initiation of the procedure. The right groin was prepped and draped in the usual sterile fashion. Using a micropuncture kit and the modified Seldinger technique, access was gained to the right common femoral artery and a 5 French sheath was placed. Real-time ultrasound guidance was utilized for vascular access including the acquisition of a permanent ultrasound image documenting patency of the accessed vessel. Under fluoroscopy, a 5 [place] Berenstein 2 catheter and a 0.035" Terumo Glidewire into the aortic arch. The catheter was placed into the right common carotid artery. Frontal and lateral angiograms of neck were obtained. Under biplane roadmap, the catheter was advanced into the right internal carotid artery. Frontal and lateral angiograms of the head were obtained. The catheter was then placed into the right subclavian artery. Frontal and lateral angiograms of the neck were obtained. Under roadmap guidance, the catheter was placed into the right vertebral artery. Frontal and lateral angiograms  of the head were obtained. Next, the catheter was placed into the left subclavian artery. Frontal lateral angiogram the neck were obtained. Under roadmap guidance, the catheter was placed into the left vertebral artery. Frontal and lateral angiograms of the head were obtained. Finally, the catheter was placed into the left common carotid artery. Frontal and lateral angiograms of the neck were obtained. Under biplane roadmap, the catheter was advanced into the left internal carotid artery. Frontal and angiograms of the head were obtained. The catheter was subsequently withdrawn. A right  common femoral artery angiogram was obtained in right anterior oblique view. A 5 French Angio-Seal was utilized for access closure. Immediate hemostasis was achieved. FINDINGS: Right CCA angiograms: Cervical angiograms show mild atherosclerotic changes of the right carotid bulb without hemodynamically significant stenosis. Right ICA angiograms: Brisk vascular contrast filling of the right ACA and MCA vascular trees. There is also brisk contrast opacification of the basilar artery and bilateral posterior cerebral arteries via right posterior communicating artery. Filling defect with severe stenosis is noted in the proximal basilar artery and moderate stenosis is noted at the mid basilar artery. No aneurysms or abnormally high-flow, early draining veins are seen. No regions of abnormal hypervascularity are noted. The visualized dural sinuses are patent. Right subclavian angiograms: Normal caliber of the right subclavian artery. Diffuse small caliber of the right vertebral artery with focal area of severe stenosis at the V1-V2 junction moderate stenosis at the mid V2 segment. Right vertebral artery angiograms: Moderate stenosis of the intracranial right vertebral artery distal to the origin of the right PICA with filling defect noted within the proximal basilar artery resulting in severe stenosis with delayed distal opacification.  Luminal irregularity of the basilar artery, consistent with intracranial atherosclerotic disease. There is rapid washout of the distal basilar artery consistent with retrograde flow. No aneurysms or abnormally high-flow, early draining veins are seen. No regions of abnormal hypervascularity are noted. The visualized dural sinuses are patent. Left subclavian angiograms: Normal caliber of the left subclavian artery. Diffuse atherosclerotic disease is noted of the cervical left vertebral artery with multifocal areas of mild to moderate stenosis. Left vertebral artery angiograms: Occlusion of the intracranial left vertebral artery distal to the PICA origin with luminal irregularity of the proximal segment, consistent with atherosclerotic disease. Left CCA angiograms: Mild atherosclerotic changes of the left carotid bifurcation without hemodynamically significant stenosis. Left ICA angiograms: Brisk vascular contrast filling of the left ACA and MCA vascular trees. There is also brisk contrast opacification of the basilar artery and bilateral posterior cerebral arteries via left posterior communicating artery. Filling defect with severe stenosis is noted in the proximal basilar artery and moderate stenosis is noted at the mid basilar artery. No aneurysms or abnormally high-flow, early draining veins are seen. No regions of abnormal hypervascularity are noted. The visualized dural sinuses are patent. Right common femoral artery angiogram and ultrasound: Patent right common femoral artery. The access is at the level of the mid right common femoral artery. Atherosclerotic changes are noted along the right common femoral artery without hemodynamically significant stenosis, with adequate caliber for closure device. PROCEDURE: No intervention performed. IMPRESSION: 1. 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 Predominantly  retrograde flow to the basilar artery via bilateral posterior communicating arteries. Focal area of moderate stenosis is also seen in the mid basilar artery related to atherosclerotic disease. 2. Atherosclerotic changes of the dominant left vertebral artery with multifocal areas of mild-to-moderate stenosis in the cervical segment and occlusion of the intracranial segment distal to the left PICA origin. 3. Atherosclerotic changes of the non dominant right vertebral artery with focal area of severe stenosis in the proximal cervical segment and moderate stenosis in the distal intracranial segment. 4. Mild atherosclerotic changes of the bilateral carotid bifurcations without hemodynamically significant stenosis. 5. No significant anterior circulation intracranial atherosclerotic disease. PLAN: Case discussed with Dr. Leonie Man. Best medical management recommended. Cervical and intracranial angioplasty and stenting may be considered in case of treatment failure. Electronically Signed  By: Pedro Earls M.D.   On: 03/13/2021 14:42   IR ANGIO VERTEBRAL SEL VERTEBRAL BILAT MOD SED  Result Date: 03/13/2021 INDICATION: Kin Galbraith is a 70 y.o. male with a medical history significant for HTN and prior stroke. He presented to the [facility] ED 03/11/21 with complaints of high blood pressure, dizziness and headache. 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 Multifocal moderate-to-severe- stenosis of the left vertebral artery V1 and V2 segements also noted. A diagnostic cerebral angiogram was requested to better evaluate CT angiogram findings. EXAM: ULTRASOUND-GUIDED VASCULAR ACCESS DIAGNOSTIC CEREBRAL ANGIOGRAM COMPARISON:  CT angiogram of the head and neck March 11, 2021. MEDICATIONS: 10 mg of hydralazine IV; 5 mg of  verapamil IA. ANESTHESIA/SEDATION: Versed 1.5 mg IV; Fentanyl 25 mcg IV Moderate Sedation Time:  50 minutes The patient was continuously monitored during the procedure by the interventional radiology nurse under my direct supervision. CONTRAST:  54 mL of Omnipaque 240 milligram/mL FLUOROSCOPY TIME:  Fluoroscopy Time: 11 minutes 24 seconds (277 mGy). COMPLICATIONS: None immediate. TECHNIQUE: Informed written consent was obtained from the patient after a thorough discussion of the procedural risks, benefits and alternatives. All questions were addressed. Maximal Sterile Barrier Technique was utilized including caps, mask, sterile gowns, sterile gloves, sterile drape, hand hygiene and skin antiseptic. A timeout was performed prior to the initiation of the procedure. The right groin was prepped and draped in the usual sterile fashion. Using a micropuncture kit and the modified Seldinger technique, access was gained to the right common femoral artery and a 5 French sheath was placed. Real-time ultrasound guidance was utilized for vascular access including the acquisition of a permanent ultrasound image documenting patency of the accessed vessel. Under fluoroscopy, a 5 [place] Berenstein 2 catheter and a 0.035" Terumo Glidewire into the aortic arch. The catheter was placed into the right common carotid artery. Frontal and lateral angiograms of neck were obtained. Under biplane roadmap, the catheter was advanced into the right internal carotid artery. Frontal and lateral angiograms of the head were obtained. The catheter was then placed into the right subclavian artery. Frontal and lateral angiograms of the neck were obtained. Under roadmap guidance, the catheter was placed into the right vertebral artery. Frontal and lateral angiograms of the head were obtained. Next, the catheter was placed into the left subclavian artery. Frontal lateral angiogram the neck were obtained. Under roadmap guidance, the catheter was placed  into the left vertebral artery. Frontal and lateral angiograms of the head were obtained. Finally, the catheter was placed into the left common carotid artery. Frontal and lateral angiograms of the neck were obtained. Under biplane roadmap, the catheter was advanced into the left internal carotid artery. Frontal and angiograms of the head were obtained. The catheter was subsequently withdrawn. A right common femoral artery angiogram was obtained in right anterior oblique view. A 5 French Angio-Seal was utilized for access closure. Immediate hemostasis was achieved. FINDINGS: Right CCA angiograms: Cervical angiograms show mild atherosclerotic changes of the right carotid bulb without hemodynamically significant stenosis. Right ICA angiograms: Brisk vascular contrast filling of the right ACA and MCA vascular trees. There is also brisk contrast opacification of the basilar artery and bilateral posterior cerebral arteries via right posterior communicating artery. Filling defect with severe stenosis is noted in the proximal basilar artery and moderate stenosis is noted at the  mid basilar artery. No aneurysms or abnormally high-flow, early draining veins are seen. No regions of abnormal hypervascularity are noted. The visualized dural sinuses are patent. Right subclavian angiograms: Normal caliber of the right subclavian artery. Diffuse small caliber of the right vertebral artery with focal area of severe stenosis at the V1-V2 junction moderate stenosis at the mid V2 segment. Right vertebral artery angiograms: Moderate stenosis of the intracranial right vertebral artery distal to the origin of the right PICA with filling defect noted within the proximal basilar artery resulting in severe stenosis with delayed distal opacification. Luminal irregularity of the basilar artery, consistent with intracranial atherosclerotic disease. There is rapid washout of the distal basilar artery consistent with retrograde flow. No  aneurysms or abnormally high-flow, early draining veins are seen. No regions of abnormal hypervascularity are noted. The visualized dural sinuses are patent. Left subclavian angiograms: Normal caliber of the left subclavian artery. Diffuse atherosclerotic disease is noted of the cervical left vertebral artery with multifocal areas of mild to moderate stenosis. Left vertebral artery angiograms: Occlusion of the intracranial left vertebral artery distal to the PICA origin with luminal irregularity of the proximal segment, consistent with atherosclerotic disease. Left CCA angiograms: Mild atherosclerotic changes of the left carotid bifurcation without hemodynamically significant stenosis. Left ICA angiograms: Brisk vascular contrast filling of the left ACA and MCA vascular trees. There is also brisk contrast opacification of the basilar artery and bilateral posterior cerebral arteries via left posterior communicating artery. Filling defect with severe stenosis is noted in the proximal basilar artery and moderate stenosis is noted at the mid basilar artery. No aneurysms or abnormally high-flow, early draining veins are seen. No regions of abnormal hypervascularity are noted. The visualized dural sinuses are patent. Right common femoral artery angiogram and ultrasound: Patent right common femoral artery. The access is at the level of the mid right common femoral artery. Atherosclerotic changes are noted along the right common femoral artery without hemodynamically significant stenosis, with adequate caliber for closure device. PROCEDURE: No intervention performed. IMPRESSION: 1. 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 Predominantly retrograde flow to the basilar artery via bilateral posterior communicating arteries. Focal area of moderate stenosis is also seen in the mid basilar artery related to atherosclerotic  disease. 2. Atherosclerotic changes of the dominant left vertebral artery with multifocal areas of mild-to-moderate stenosis in the cervical segment and occlusion of the intracranial segment distal to the left PICA origin. 3. Atherosclerotic changes of the non dominant right vertebral artery with focal area of severe stenosis in the proximal cervical segment and moderate stenosis in the distal intracranial segment. 4. Mild atherosclerotic changes of the bilateral carotid bifurcations without hemodynamically significant stenosis. 5. No significant anterior circulation intracranial atherosclerotic disease. PLAN: Case discussed with Dr. Leonie Man. Best medical management recommended. Cervical and intracranial angioplasty and stenting may be considered in case of treatment failure. Electronically Signed   By: Pedro Earls M.D.   On: 03/13/2021 14:42    DISCHARGE EXAMINATION: Vitals:   03/13/21 0950 03/13/21 0955 03/13/21 1010 03/13/21 1044  BP: (!) 169/88 (!) 156/87 (!) 167/91 (!) 162/81  Pulse: 77 80 80 96  Resp: 18 (!) _0 Temp:      TempSrc:      SpO2: 100% 99% 98% 99%   General appearance: Awake alert.  In no distress Resp: Clear to auscultation bilaterally.  Normal effort Cardio: S1-S2 is normal regular.  No S3-S4.  No rubs murmurs or bruit GI: Abdomen is soft.  Nontender nondistended.  Bowel sounds are present normal.  No masses organomegaly    DISPOSITION: Home with home health  Discharge Instructions     Ambulatory referral to Neurology   Complete by: As directed    An appointment is requested in approximately: 8 weeks for acute stroke   Call MD for:  difficulty breathing, headache or visual disturbances   Complete by: As directed    Call MD for:  extreme fatigue   Complete by: As directed    Call MD for:  persistant dizziness or light-headedness   Complete by: As directed    Call MD for:  persistant nausea and vomiting   Complete by: As directed    Call MD  for:  severe uncontrolled pain   Complete by: As directed    Call MD for:  temperature >100.4   Complete by: As directed    Diet - low sodium heart healthy   Complete by: As directed    Discharge instructions   Complete by: As directed    Please take your medications as prescribed.  Follow-up with your primary care provider in 1 week.   Your metoprolol has been held to keep your systolic blood pressure between 160 and 180 as recommended by neurology.  Your primary care provider may choose to resume it at a lower dose at follow-up.  You were cared for by a hospitalist during your hospital stay. If you have any questions about your discharge medications or the care you received while you were in the hospital after you are discharged, you can call the unit and asked to speak with the hospitalist on call if the hospitalist that took care of you is not available. Once you are discharged, your primary care physician will handle any further medical issues. Please note that NO REFILLS for any discharge medications will be authorized once you are discharged, as it is imperative that you return to your primary care physician (or establish a relationship with a primary care physician if you do not have one) for your aftercare needs so that they can reassess your need for medications and monitor your lab values. If you do not have a primary care physician, you can call [phone] for a physician referral.   Increase activity slowly   Complete by: As directed    No wound care   Complete by: As directed          Allergies as of 03/14/2021       Reactions   Lisinopril Swelling   "swelling of face and lips"        Medication List     STOP taking these medications    metoprolol tartrate 100 MG tablet Commonly known as: LOPRESSOR   simvastatin 20 MG tablet Commonly known as: ZOCOR       TAKE these medications    aspirin 81 MG EC tablet Take 1 tablet (81 mg total) by mouth daily. Swallow  whole. What changed:  medication strength how much to take   atorvastatin 40 MG tablet Commonly known as: LIPITOR Take 1 tablet (40 mg total) by mouth at bedtime.   cloNIDine 0.1 MG tablet Commonly known as: CATAPRES Take 1 tablet (0.1 mg total) by mouth daily. What changed: when to take this   clopidogrel 75 MG tablet Commonly known as: PLAVIX Take 1 tablet (75 mg total) by mouth daily. For 3 months   multivitamin with minerals Tabs tablet  Take 1 tablet by mouth daily.          Follow-up Information     Lin Landsman, MD Follow up in 1 week(s).   Specialty: Family Medicine Contact information: [place] [address] [phone]         Care, [facility] Follow up.   Specialty: Home Health Services Why: Alvis Lemmings will contact you to schedule your first home visit. Contact information: [place] [address] [phone]                 TOTAL DISCHARGE TIME: 35 minutes  [facility] Pager on [email or web address]  03/15/2021, 1:06 PM

## 2021-03-27 ENCOUNTER — Other Ambulatory Visit: Payer: Self-pay | Admitting: *Deleted

## 2021-03-27 NOTE — Patient Outreach (Signed)
[facility]) Care Management  03/27/2021  [name] Jul 03, 1951 [id number]   [facility] Unsuccessful outreach to EMMI stroke patient  [name] was referred to [facility] on 03/22/21 after red alert flag for EMMI stroke from a Wednesday 03/21/21 outreach which stated Smoked or been around smoke? yes   Insurance  [employer] [facility]) medicare     Outreach attempt to the home number [phone] No answer. No voice mail box  Plan: [facility] RN CM scheduled this patient for another call attempt within 4-7 business days Unsuccessful outreach letter sent on 03/27/21 Unsuccessful outreach on 03/27/21   [name] [name], RN, BSN, CCM [facility] Telephonic Care Management Care Coordinator Office number [phone] Mobile number [phone]  Main [facility] number [phone] Fax number [phone]

## 2021-04-02 ENCOUNTER — Other Ambulatory Visit: Payer: Self-pay

## 2021-04-02 ENCOUNTER — Encounter: Payer: Self-pay | Admitting: *Deleted

## 2021-04-02 ENCOUNTER — Other Ambulatory Visit: Payer: Self-pay | Admitting: *Deleted
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
## 2021-05-30 ENCOUNTER — Inpatient Hospital Stay: Payer: Medicare Other | Admitting: Neurology

## 2021-07-26 ENCOUNTER — Inpatient Hospital Stay: Payer: Medicare Other | Admitting: Neurology

## 2021-07-26 ENCOUNTER — Encounter: Payer: Self-pay | Admitting: Neurology

## 2022-04-08 IMAGING — XA IR CAROTID INTERNAL HEAD/NECK BILAT  (MS)
10 of 15 series · 11 of 24 positions shown · IV contrast (IODINE)
Comparison: CT angiogram of the head and neck March 11, 2021.

INDICATION: Waga Zawi is a 69 y.o. male with a medical history significant
for HTN and prior stroke. [facility]11/21 with
complaints of high blood pressure, dizziness and headache. CT head
revealed an old left parietal lobe ischemic infarction with no acute
abnormalities. CTA revealed occlusion of the left vertebral artery
V4 segment and the proximal basilar artery with reconstitution
distally; age indeterminate. Multifocal moderate-to-severe- stenosis
of the left vertebral artery V1 and V2 segements also noted. A
diagnostic cerebral angiogram was requested to better evaluate CT
angiogram findings.

EXAM:
ULTRASOUND-GUIDED VASCULAR [REDACTED] CEREBRAL ANGIOGRAM
TECHNIQUE: Informed written consent was obtained from the patient after a
thorough discussion of the procedural risks, benefits and
alternatives. All questions were addressed.

[Series 2: cerebral care 2 · 2 acquisitions, 1 frame shown (1 of 6)]
[im 1/2]
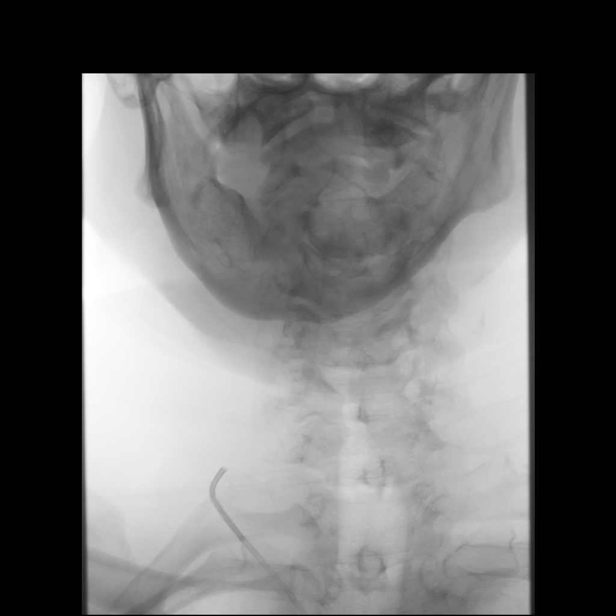

[Series 3: cerebral care 2 · 2 acquisitions, 1 frame shown (2 of 6)]
[im 1/2]
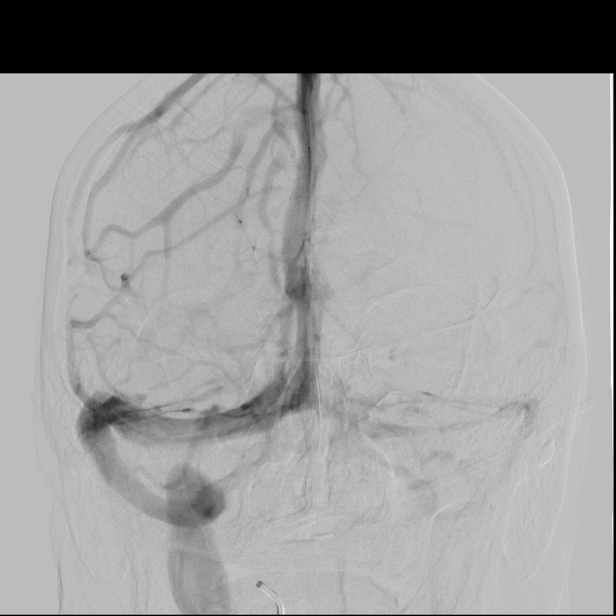

[Series 5: cerebral care 2 · 2 acquisitions, 1 frame shown (3 of 6)]
[im 1/2  full-range]
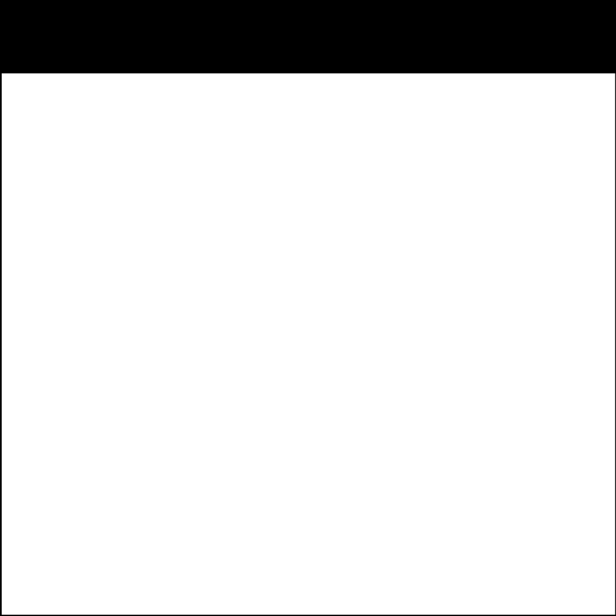

[Series 6: cerebral care 2 · 2 acquisitions, 1 frame shown (4 of 6)]
[im 1/2]
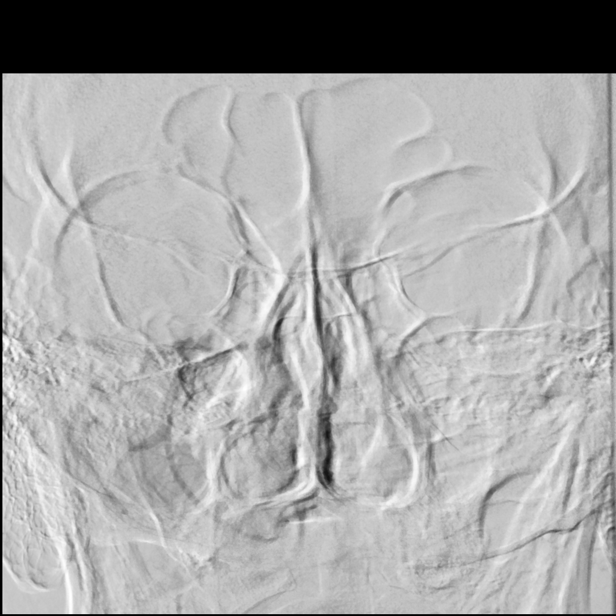

[Series 7: fl neuro n · 1 of 45 frames shown (1 of 3)]
[frame 39/45]
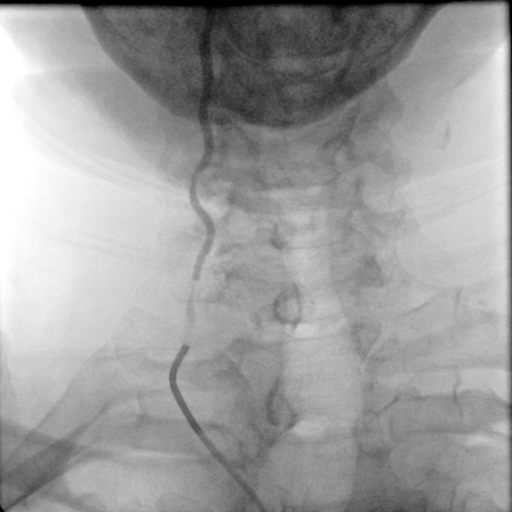

[Series 9: cerebral care 2 · 2 acquisitions, 1 frame shown (5 of 6)]
[im 1/2]
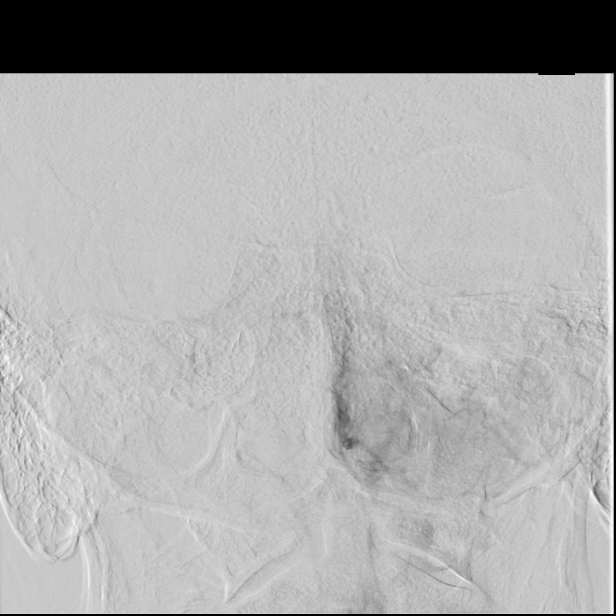

[Series 11: fl neuro n · 1 of 18 frames shown (2 of 3)]
[frame 3/18]
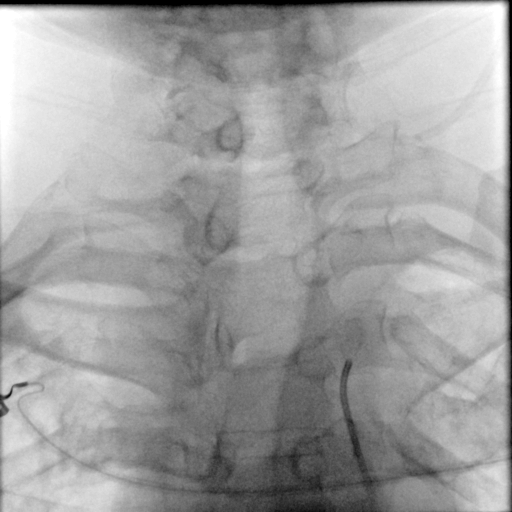

[Series 12: cerebral care 2 · 2 acquisitions, 1 frame shown (6 of 6)]
[im 1/2]
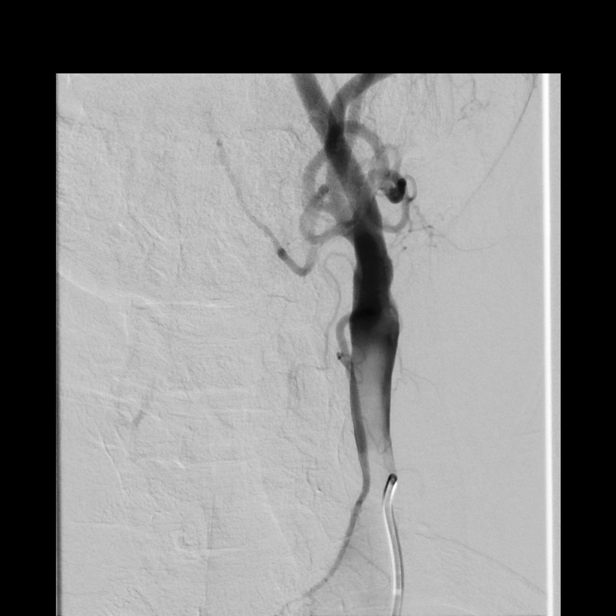

[Series 14: fl neuro n · 1 of 43 frames shown (3 of 3)]
[frame 1/43]
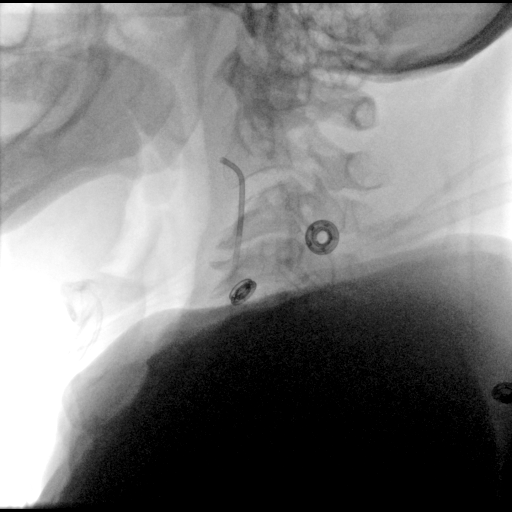

[Series 300: ir angio intra extracran sel com carotid · 2 of 51 slices shown]
[im 6/51]
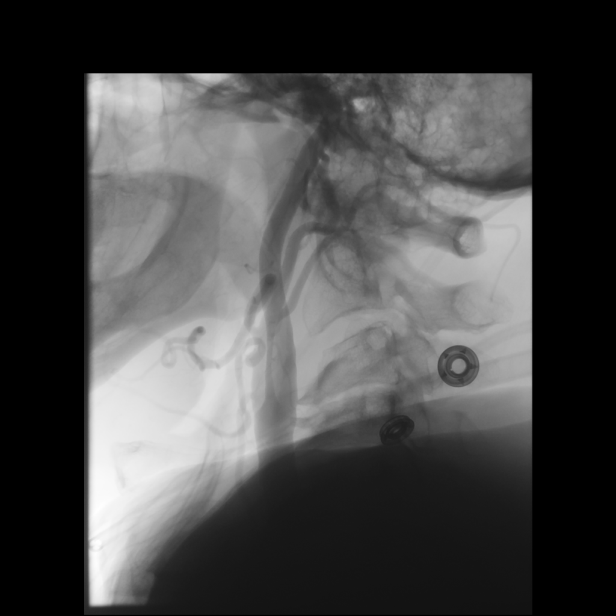
[im 34/51]
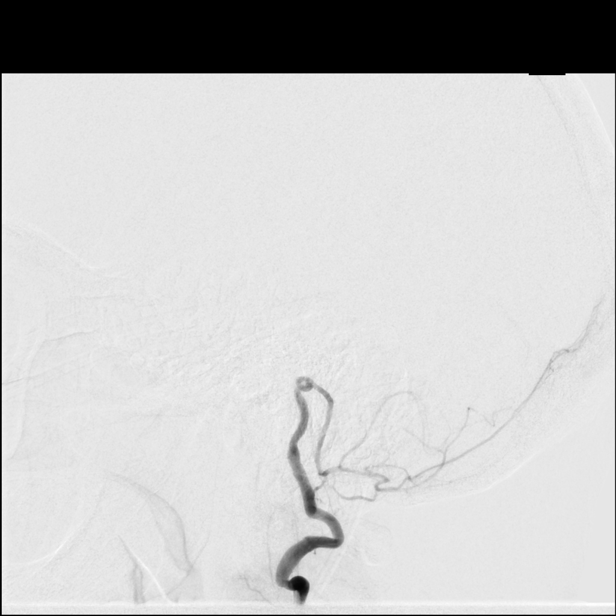

[11 of 24 positions shown; findings below may reference images not displayed]

MEDICATIONS:
10 mg of hydralazine IV; 5 mg of verapamil IA.

ANESTHESIA/SEDATION:
Versed 1.5 mg IV; Fentanyl 25 mcg IV

Moderate Sedation Time:  50 minutes

The patient was continuously monitored during the procedure by the
interventional radiology nurse under my direct supervision.

CONTRAST:  54 mL of Omnipaque 240 milligram/mL

FLUOROSCOPY TIME:  Fluoroscopy Time: 11 minutes 24 seconds (277
mGy).

COMPLICATIONS:
None immediate.
Maximal Sterile Barrier Technique was utilized including caps, mask,
sterile gowns, sterile gloves, sterile drape, hand hygiene and skin
antiseptic. A timeout was performed prior to the initiation of the
procedure.

The right groin was prepped and draped in the usual sterile fashion.
Using a micropuncture kit and the modified Seldinger technique,
access was gained to the right common femoral artery and a 5 French
sheath was placed. Real-time ultrasound guidance was utilized for
vascular access including the acquisition of a permanent ultrasound
image documenting patency of the accessed vessel.

Under fluoroscopy, a 5 [place] 2 catheter and a 0.035"
Terumo Glidewire into the aortic arch. The catheter was placed into
the right common carotid artery. Frontal and lateral angiograms of
neck were obtained. Under biplane roadmap, the catheter was advanced
into the right internal carotid artery. Frontal and lateral
angiograms of the head were obtained.

The catheter was then placed into the right subclavian artery.
Frontal and lateral angiograms of the neck were obtained. Under
roadmap guidance, the catheter was placed into the right vertebral
artery. Frontal and lateral angiograms of the head were obtained.

Next, the catheter was placed into the left subclavian artery.
Frontal lateral angiogram the neck were obtained. Under roadmap
guidance, the catheter was placed into the left vertebral artery.
Frontal and lateral angiograms of the head were obtained.

Finally, the catheter was placed into the left common carotid
artery. Frontal and lateral angiograms of the neck were obtained.
Under biplane roadmap, the catheter was advanced into the left
internal carotid artery. Frontal and angiograms of the head were
obtained. The catheter was subsequently withdrawn.

A right common femoral artery angiogram was obtained in right
anterior oblique view.

A 5 French Angio-Seal was utilized for access closure. Immediate
hemostasis was achieved.
FINDINGS: Right CCA angiograms: Cervical angiograms show mild atherosclerotic
changes of the right carotid bulb without hemodynamically
significant stenosis.

Right ICA angiograms: Brisk vascular contrast filling of the right
ACA and MCA vascular trees. There is also brisk contrast
opacification of the basilar artery and bilateral posterior cerebral
arteries via right posterior communicating artery. Filling defect
with severe stenosis is noted in the proximal basilar artery and
moderate stenosis is noted at the mid basilar artery. No aneurysms
or abnormally high-flow, early draining veins are seen. No regions
of abnormal hypervascularity are noted. The visualized dural sinuses
are patent.

Right subclavian angiograms: Normal caliber of the right subclavian
artery. Diffuse small caliber of the right vertebral artery with
focal area of severe stenosis at the V1-V2 junction moderate
stenosis at the mid V2 segment.

Right vertebral artery angiograms: Moderate stenosis of the
intracranial right vertebral artery distal to the origin of the
right PICA with filling defect noted within the proximal basilar
artery resulting in severe stenosis with delayed distal
opacification. Luminal irregularity of the basilar artery,
consistent with intracranial atherosclerotic disease. There is rapid
washout of the distal basilar artery consistent with retrograde
flow. No aneurysms or abnormally high-flow, early draining veins are
seen. No regions of abnormal hypervascularity are noted. The
visualized dural sinuses are patent.

Left subclavian angiograms: Normal caliber of the left subclavian
artery. Diffuse atherosclerotic disease is noted of the cervical
left vertebral artery with multifocal areas of mild to moderate
stenosis.

Left vertebral artery angiograms: Occlusion of the intracranial left
vertebral artery distal to the PICA origin with luminal irregularity
of the proximal segment, consistent with atherosclerotic disease.

Left CCA angiograms: Mild atherosclerotic changes of the left
carotid bifurcation without hemodynamically significant stenosis.

Left ICA angiograms: Brisk vascular contrast filling of the left ACA
and MCA vascular trees. There is also brisk contrast opacification
of the basilar artery and bilateral posterior cerebral arteries via
left posterior communicating artery. Filling defect with severe
stenosis is noted in the proximal basilar artery and moderate
stenosis is noted at the mid basilar artery. No aneurysms or
abnormally high-flow, early draining veins are seen. No regions of
abnormal hypervascularity are noted. The visualized dural sinuses
are patent.

Right common femoral artery angiogram and ultrasound: Patent right
common femoral artery. The access is at the level of the mid right
common femoral artery. Atherosclerotic changes are noted along the
right common femoral artery without hemodynamically significant
stenosis, with adequate caliber for closure device.

PROCEDURE:
No intervention performed.
IMPRESSION: 1. Severe vertebrobasilar atherosclerotic disease with superimposed
filling defect within the proximal basilar artery resulting in
severe stenosis. Predominantly retrograde flow to the basilar artery
via bilateral posterior communicating arteries. Focal area of
moderate stenosis is also seen in the mid basilar artery related to
atherosclerotic disease.
2. Atherosclerotic changes of the dominant left vertebral artery
with multifocal areas of mild-to-moderate stenosis in the cervical
segment and occlusion of the intracranial segment distal to the left
PICA origin.
3. Atherosclerotic changes of the non dominant right vertebral
artery with focal area of severe stenosis in the proximal cervical
segment and moderate stenosis in the distal intracranial segment.
4. Mild atherosclerotic changes of the bilateral carotid
bifurcations without hemodynamically significant stenosis.
5. No significant anterior circulation intracranial atherosclerotic
disease.

PLAN:
Case discussed with Elena. Annamma. Best medical management recommended.
Cervical and intracranial angioplasty and stenting may be considered
in case of treatment failure.
